# Patient Record
Sex: Male | Born: 1940
Health system: Southern US, Community
[De-identification: ages and names within clinical notes are randomized; demographics above are authoritative.]

## PROBLEM LIST (undated history)

## (undated) DIAGNOSIS — E119 Type 2 diabetes mellitus without complications: Secondary | ICD-10-CM

## (undated) DIAGNOSIS — I10 Essential (primary) hypertension: Secondary | ICD-10-CM

## (undated) DIAGNOSIS — E7800 Pure hypercholesterolemia, unspecified: Secondary | ICD-10-CM

## (undated) HISTORY — PX: TUMOR REMOVAL: SHX12

## (undated) HISTORY — PX: BACK SURGERY: SHX140

---

## 2006-08-20 ENCOUNTER — Observation Stay (HOSPITAL_COMMUNITY): Admission: AD | Admit: 2006-08-20 | Discharge: 2006-08-22 | Payer: Self-pay | Admitting: Cardiology

## 2006-08-20 ENCOUNTER — Ambulatory Visit: Payer: Self-pay | Admitting: Cardiology

## 2006-09-11 ENCOUNTER — Inpatient Hospital Stay (HOSPITAL_COMMUNITY): Admission: RE | Admit: 2006-09-11 | Discharge: 2006-09-15 | Payer: Self-pay | Admitting: Thoracic Surgery

## 2006-09-17 ENCOUNTER — Ambulatory Visit: Payer: Self-pay | Admitting: Cardiology

## 2006-09-26 ENCOUNTER — Encounter: Admission: RE | Admit: 2006-09-26 | Discharge: 2006-09-26 | Payer: Self-pay | Admitting: Thoracic Surgery

## 2006-10-31 ENCOUNTER — Encounter: Admission: RE | Admit: 2006-10-31 | Discharge: 2006-10-31 | Payer: Self-pay | Admitting: Thoracic Surgery

## 2006-12-26 ENCOUNTER — Ambulatory Visit: Payer: Self-pay | Admitting: Thoracic Surgery

## 2006-12-26 ENCOUNTER — Encounter: Admission: RE | Admit: 2006-12-26 | Discharge: 2006-12-26 | Payer: Self-pay | Admitting: Thoracic Surgery

## 2007-04-29 IMAGING — CR DG CHEST 2V
2 series · 2 of 2 positions shown · non-contrast
Comparison: 09/14/2006

CLINICAL DATA: Paraspinal mass

CHEST - 2 VIEW:

[w chest pa]
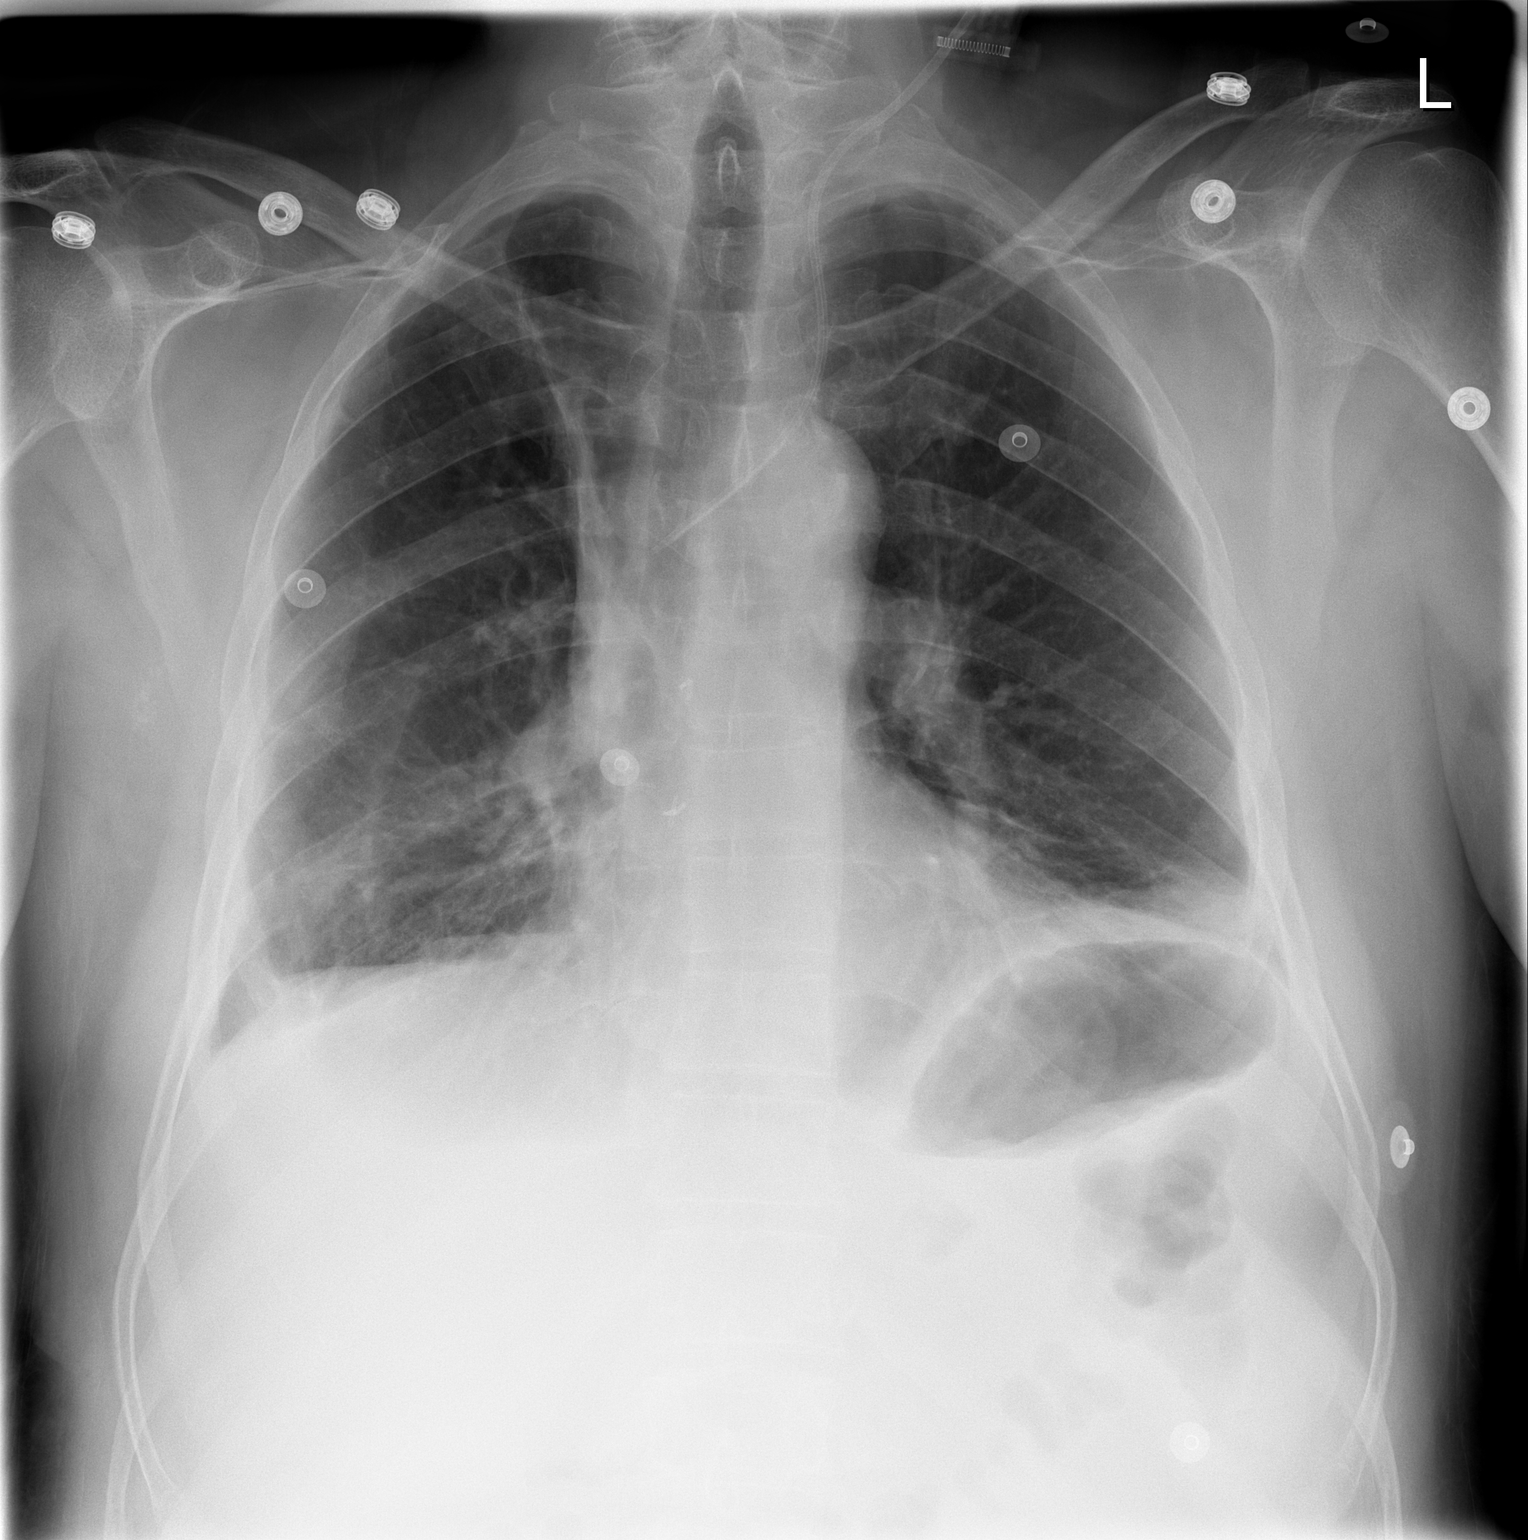

[w chest lat]
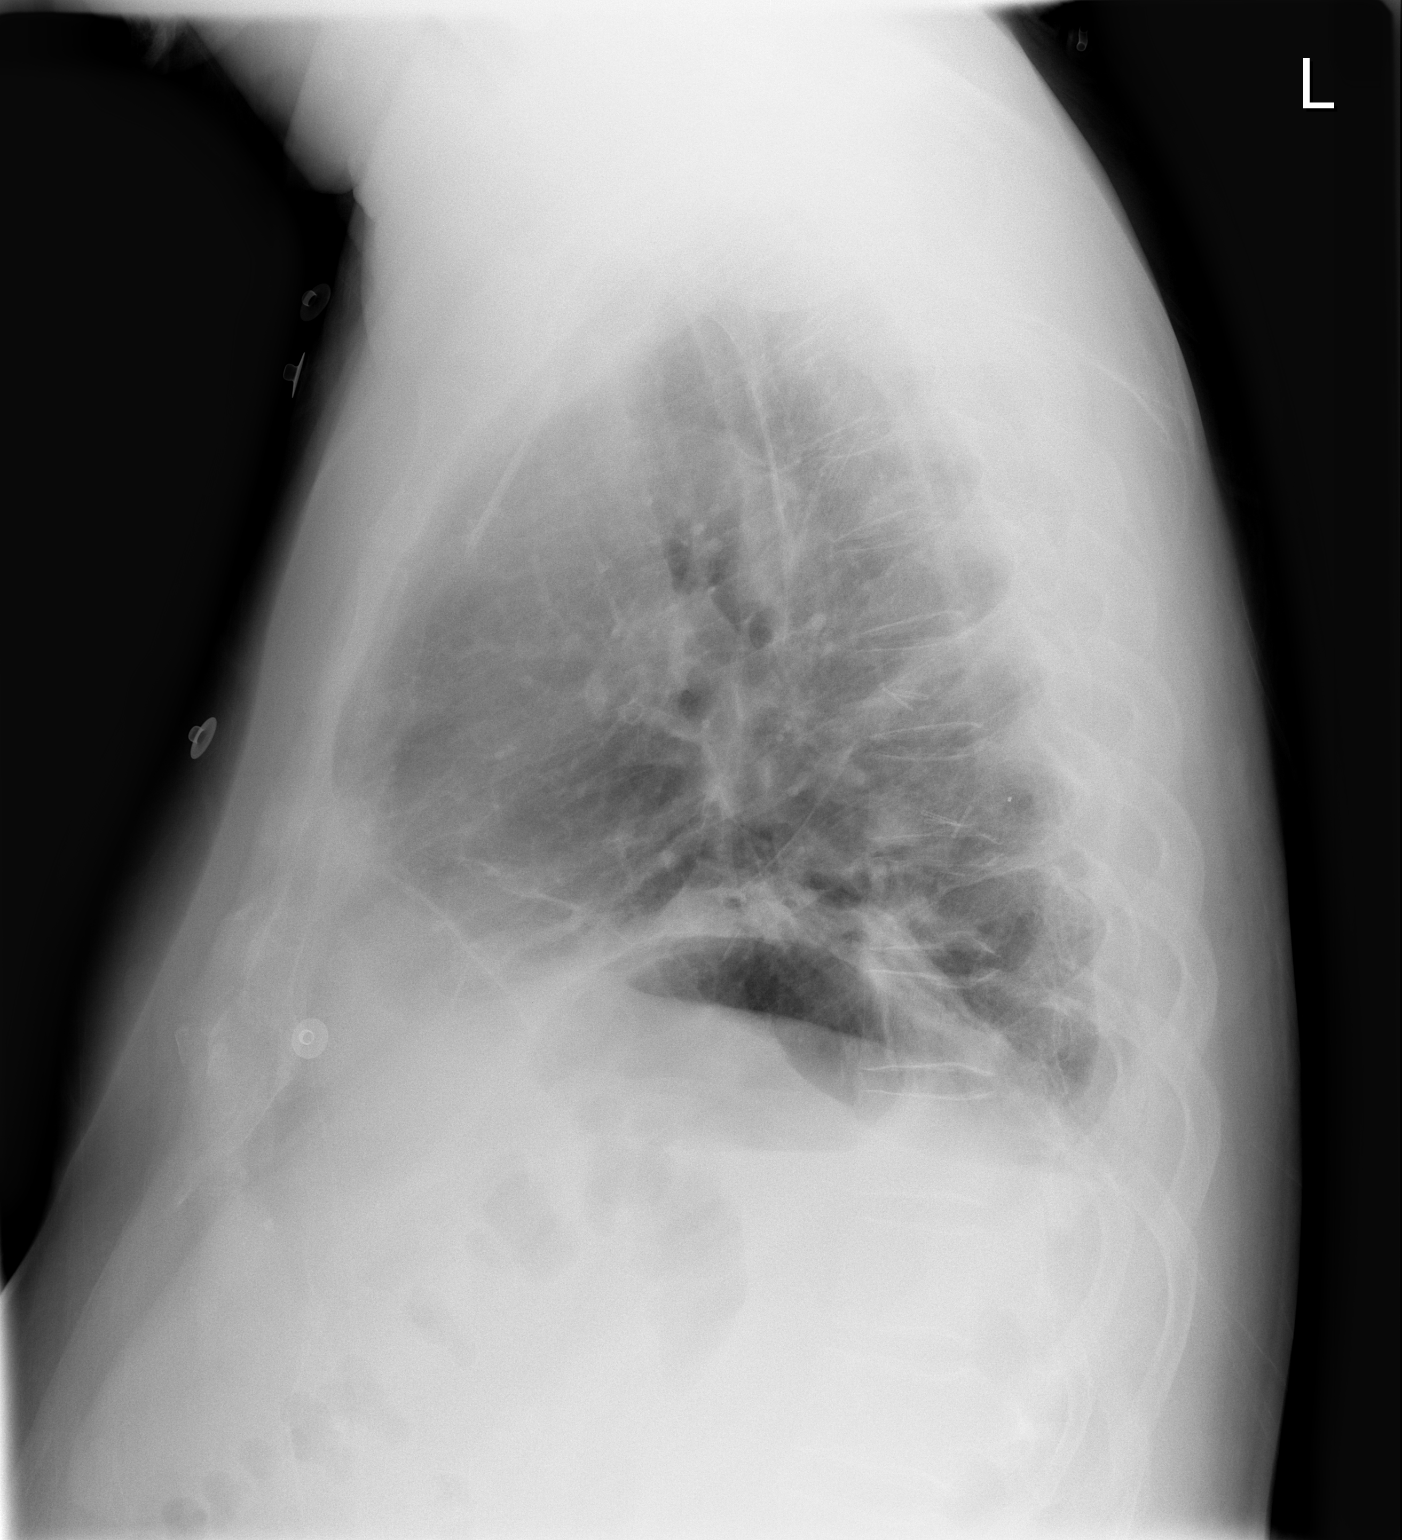

[2 of 2 positions shown; findings below may reference images not displayed]

FINDINGS: Left central line remains in place. There appear to be air-fluid
levels at the right lung base which could represent small loculated right
hydropneumothorax. This cannot be confirmed on the lateral view. Recommend
followup. There is bibasilar atelectasis, slightly decreased since prior study.
Slight increase in lung volumes. Heart is normal size.
IMPRESSION: Probable loculated right basilar hydropneumothorax. This cannot be confirmed on
the lateral. Recommend attention on followup studies.

Bibasilar atelectasis. Slight improvement in lung volumes.

## 2010-10-23 ENCOUNTER — Encounter: Payer: Self-pay | Admitting: Cardiovascular Disease

## 2010-10-23 ENCOUNTER — Encounter: Payer: Self-pay | Admitting: Thoracic Surgery

## 2013-10-26 ENCOUNTER — Emergency Department (HOSPITAL_COMMUNITY): Payer: Medicare PPO

## 2013-10-26 ENCOUNTER — Encounter (HOSPITAL_COMMUNITY): Payer: Self-pay | Admitting: Emergency Medicine

## 2013-10-26 ENCOUNTER — Emergency Department (HOSPITAL_COMMUNITY)
Admission: EM | Admit: 2013-10-26 | Discharge: 2013-10-27 | Disposition: A | Discharge status: Home / Self Care | Payer: Medicare PPO | Attending: Emergency Medicine | Admitting: Emergency Medicine

## 2013-10-26 DIAGNOSIS — E7800 Pure hypercholesterolemia, unspecified: Secondary | ICD-10-CM | POA: Insufficient documentation

## 2013-10-26 DIAGNOSIS — K5289 Other specified noninfective gastroenteritis and colitis: Secondary | ICD-10-CM | POA: Insufficient documentation

## 2013-10-26 DIAGNOSIS — Z880 Allergy status to penicillin: Secondary | ICD-10-CM | POA: Insufficient documentation

## 2013-10-26 DIAGNOSIS — Z79899 Other long term (current) drug therapy: Secondary | ICD-10-CM | POA: Insufficient documentation

## 2013-10-26 DIAGNOSIS — K529 Noninfective gastroenteritis and colitis, unspecified: Secondary | ICD-10-CM

## 2013-10-26 HISTORY — DX: Pure hypercholesterolemia, unspecified: E78.00

## 2013-10-26 LAB — URINALYSIS, ROUTINE W REFLEX MICROSCOPIC
BILIRUBIN URINE: NEGATIVE
Glucose, UA: NEGATIVE mg/dL
Ketones, ur: NEGATIVE mg/dL
LEUKOCYTES UA: NEGATIVE
NITRITE: NEGATIVE
PROTEIN: NEGATIVE mg/dL
Specific Gravity, Urine: >1.03 — ABNORMAL HIGH (ref 1.005–1.030)
UROBILINOGEN UA: 0.2 mg/dL (ref 0.0–1.0)
pH: 6 (ref 5.0–8.0)

## 2013-10-26 LAB — CBC WITH DIFFERENTIAL/PLATELET
BASOS PCT: 0 % (ref 0–1)
Basophils Absolute: 0 10*3/uL (ref 0.0–0.1)
Eosinophils Absolute: 0.1 10*3/uL (ref 0.0–0.7)
Eosinophils Relative: 1 % (ref 0–5)
HCT: 45.7 % (ref 39.0–52.0)
Hemoglobin: 15.7 g/dL (ref 13.0–17.0)
LYMPHS ABS: 2.8 10*3/uL (ref 0.7–4.0)
LYMPHS PCT: 22 % (ref 12–46)
MCH: 28.6 pg (ref 26.0–34.0)
MCHC: 34.4 g/dL (ref 30.0–36.0)
MCV: 83.4 fL (ref 78.0–100.0)
MONOS PCT: 11 % (ref 3–12)
Monocytes Absolute: 1.5 10*3/uL — ABNORMAL HIGH (ref 0.1–1.0)
NEUTROS ABS: 8.5 10*3/uL — AB (ref 1.7–7.7)
NEUTROS PCT: 66 % (ref 43–77)
PLATELETS: 249 10*3/uL (ref 150–400)
RBC: 5.48 MIL/uL (ref 4.22–5.81)
RDW: 12 % (ref 11.5–15.5)
WBC: 12.9 10*3/uL — ABNORMAL HIGH (ref 4.0–10.5)

## 2013-10-26 LAB — URINE MICROSCOPIC-ADD ON

## 2013-10-26 LAB — COMPREHENSIVE METABOLIC PANEL
ALBUMIN: 4 g/dL (ref 3.5–5.2)
ALT: 19 U/L (ref 0–53)
AST: 14 U/L (ref 0–37)
Alkaline Phosphatase: 78 U/L (ref 39–117)
BUN: 15 mg/dL (ref 6–23)
CALCIUM: 9.4 mg/dL (ref 8.4–10.5)
CHLORIDE: 94 meq/L — AB (ref 96–112)
CO2: 31 meq/L (ref 19–32)
Creatinine, Ser: 1.06 mg/dL (ref 0.50–1.35)
GFR calc non Af Amer: 69 mL/min — ABNORMAL LOW (ref >90)
GFR, EST AFRICAN AMERICAN: 80 mL/min — AB (ref >90)
GLUCOSE: 108 mg/dL — AB (ref 70–99)
Potassium: 3.5 mEq/L — ABNORMAL LOW (ref 3.7–5.3)
Sodium: 137 mEq/L (ref 137–147)
TOTAL PROTEIN: 8.1 g/dL (ref 6.0–8.3)
Total Bilirubin: 0.7 mg/dL (ref 0.3–1.2)

## 2013-10-26 LAB — LIPASE, BLOOD: Lipase: 20 U/L (ref 11–59)

## 2013-10-26 MED ORDER — IOHEXOL 300 MG/ML  SOLN
50.0000 mL | Freq: Once | INTRAMUSCULAR | Status: AC | PRN
  Administered 2013-10-26: 50 mL via ORAL

## 2013-10-26 MED ORDER — IOHEXOL 300 MG/ML  SOLN
100.0000 mL | Freq: Once | INTRAMUSCULAR | Status: AC | PRN
  Administered 2013-10-26: 100 mL via INTRAVENOUS

## 2013-10-26 MED ORDER — ONDANSETRON HCL 4 MG/2ML IJ SOLN
4.0000 mg | Freq: Once | INTRAMUSCULAR | Status: AC
  Administered 2013-10-26: 4 mg via INTRAVENOUS
  Filled 2013-10-26: qty 2

## 2013-10-26 MED ORDER — HYDROMORPHONE HCL PF 1 MG/ML IJ SOLN
0.5000 mg | Freq: Once | INTRAMUSCULAR | Status: AC
  Administered 2013-10-26: 0.5 mg via INTRAVENOUS
  Filled 2013-10-26: qty 1

## 2013-10-26 MED ORDER — ONDANSETRON 4 MG PO TBDP: ORAL_TABLET | ORAL | Status: DC

## 2013-10-26 MED ORDER — PANTOPRAZOLE SODIUM 40 MG IV SOLR
40.0000 mg | Freq: Once | INTRAVENOUS | Status: AC
  Administered 2013-10-26: 40 mg via INTRAVENOUS
  Filled 2013-10-26: qty 40

## 2013-10-26 MED ORDER — DICYCLOMINE HCL 20 MG PO TABS: ORAL_TABLET | ORAL | Status: DC

## 2013-10-26 NOTE — Discharge Instructions (Signed)
Follow up with your md in 2 days for recheck 

## 2013-10-26 NOTE — ED Notes (Addendum)
Patient with no complaints at this time. Respirations even and unlabored. Skin warm/dry. Discharge instructions reviewed with patient at this time. IV removed and bandaid applied. Patient given opportunity to voice concerns/ask questions.. Patient discharged at this time and left Emergency Department with steady gait.

## 2013-10-26 NOTE — ED Provider Notes (Signed)
CSN: [id number]     Arrival date & time 10/26/13  1656 History   First MD Initiated Contact with Patient 10/26/13 2002     Chief Complaint  Patient presents with  . Abdominal Pain   (Consider location/radiation/quality/duration/timing/severity/associated sxs/prior Treatment) Patient is a 73 y.o. male presenting with abdominal pain. The history is provided by the patient (pt comlains of vomiting and abd pain).  Abdominal Pain Pain location:  Generalized Pain quality: aching   Pain radiates to:  Does not radiate Pain severity:  Mild Onset quality:  Gradual Timing:  Intermittent Associated symptoms: vomiting   Associated symptoms: no chest pain, no cough, no diarrhea, no fatigue and no hematuria     Past Medical History  Diagnosis Date  . Elevated cholesterol    Past Surgical History  Procedure Laterality Date  . Tumor removal     No family history on file. History  Substance Use Topics  . Smoking status: Never Smoker   . Smokeless tobacco: Not on file  . Alcohol Use: No    Review of Systems  Constitutional: Negative for appetite change and fatigue.  HENT: Negative for congestion, ear discharge and sinus pressure.   Eyes: Negative for discharge.  Respiratory: Negative for cough.   Cardiovascular: Negative for chest pain.  Gastrointestinal: Positive for vomiting and abdominal pain. Negative for diarrhea.  Genitourinary: Negative for frequency and hematuria.  Musculoskeletal: Negative for back pain.  Skin: Negative for rash.  Neurological: Negative for seizures and headaches.  Psychiatric/Behavioral: Negative for hallucinations.    Allergies  Penicillins  Home Medications   Current Outpatient Rx  Name  Route  Sig  Dispense  Refill  . atorvastatin (LIPITOR) 20 MG tablet   Oral   Take 20 mg by mouth daily.         Marland Kitchen ibuprofen (ADVIL,MOTRIN) 200 MG tablet   Oral   Take 600 mg by mouth every 6 (six) hours as needed.         . metoprolol (LOPRESSOR) 50 MG  tablet   Oral   Take 50 mg by mouth 2 (two) times daily.         Marland Kitchen omeprazole (PRILOSEC) 40 MG capsule   Oral   Take 40 mg by mouth daily.          BP 128/68  Pulse 85  Temp(Src) 98.6 F (37 C) (Oral)  Resp 18  Ht 6' (1.829 m)  Wt 232 lb (105.235 kg)  BMI 31.46 kg/m2  SpO2 91% Physical Exam  Constitutional: He is oriented to person, place, and time. He appears well-developed.  HENT:  Head: Normocephalic.  Eyes: Conjunctivae and EOM are normal. No scleral icterus.  Neck: Neck supple. No thyromegaly present.  Cardiovascular: Normal rate and regular rhythm.  Exam reveals no gallop and no friction rub.   No murmur heard. Pulmonary/Chest: No stridor. He has no wheezes. He has no rales. He exhibits no tenderness.  Abdominal: He exhibits no distension. There is tenderness. There is no rebound.  Mild tenderness throughout  Musculoskeletal: Normal range of motion. He exhibits no edema.  Lymphadenopathy:    He has no cervical adenopathy.  Neurological: He is oriented to person, place, and time. He exhibits normal muscle tone. Coordination normal.  Skin: No rash noted. No erythema.  Psychiatric: He has a normal mood and affect. His behavior is normal.    ED Course  Procedures (including critical care time) Labs Review Labs Reviewed  CBC WITH DIFFERENTIAL - Abnormal; Notable for the  following:    WBC 12.9 (*)    Neutro Abs 8.5 (*)    Monocytes Absolute 1.5 (*)    All other components within normal limits  COMPREHENSIVE METABOLIC PANEL - Abnormal; Notable for the following:    Potassium 3.5 (*)    Chloride 94 (*)    Glucose, Bld 108 (*)    GFR calc non Af Amer 69 (*)    GFR calc Af Amer 80 (*)    All other components within normal limits  URINALYSIS, ROUTINE W REFLEX MICROSCOPIC - Abnormal; Notable for the following:    Specific Gravity, Urine >1.030 (*)    Hgb urine dipstick MODERATE (*)    All other components within normal limits  LIPASE, BLOOD  URINE  MICROSCOPIC-ADD ON   Imaging Review Ct Abdomen Pelvis W Contrast  10/26/2013   CLINICAL DATA:  Abdominal pain  EXAM: CT ABDOMEN AND PELVIS WITH CONTRAST  TECHNIQUE: Multidetector CT imaging of the abdomen and pelvis was performed using the standard protocol following bolus administration of intravenous contrast.  CONTRAST:  50mL OMNIPAQUE IOHEXOL 300 MG/ML SOLN, 100mL OMNIPAQUE IOHEXOL 300 MG/ML SOLN  COMPARISON:  06/22/2009  FINDINGS: Granuloma right lower lobe. Coronary artery and/or aortic valvular calcification. Normal heart size. Mild patchy/linear left lung base opacity, favor atelectasis and/or scarring. Left hemidiaphragm elevation.  Sub cm hypodensity within the left hepatic lobe is unchanged. There are gallstones suspected. No biliary ductal dilatation. No pericholecystic fat stranding or fluid. No appreciable abnormality of the spleen, pancreas, adrenal glands, kidneys. No hydroureteronephrosis.  Mild colonic diverticulosis. No CT evidence for colitis or diverticulitis. Appendix within normal limits. Mucosal hyper enhancement and mesenteric vascular engorgement involving segments of small bowel within the pelvis (image 92/115 as index). Associated fluid collects along the left pelvis/lower paracolic gutter. No bowel wall pneumatosis. No free intraperitoneal air. No lymphadenopathy.  Scattered atherosclerosis of the aorta and branch vessels. No aneurysmal dilatation or central vascular occlusion.  Partially decompressed bladder. Prostatomegaly. Small fat containing right inguinal hernia also contains a portion of the anterior right bladder wall. Small fat containing left inguinal hernia and/or spermatic cord lipoma.  Osteopenia.  No acute osseous finding.  IMPRESSION: Thickened small bowel loops within the pelvis with adjacent fluid and mesenteric vessel engorgement. Favored to reflect a nonspecific enteritis; with ischemic, infectious, and inflammatory considerations.  Gallstones are suspected. No  definitive evidence for active inflammation. Correlate with ultrasound if there is concern for acute biliary pathology.   Electronically Signed   By: Jearld LeschAndrew  DelGaizo M.D.   On: 10/26/2013 23:17    EKG Interpretation   None       MDM  gastroenteritis    Benny LennertJoseph L Yanin Muhlestein, MD 10/26/13 2328

## 2013-10-26 NOTE — ED Notes (Signed)
Pt was seen by pcp on Tuesday given steroid and pneumonia shot, started having n/v/d, abd pain Tuesday evening, last bowel movement was this am, eating and bending over makes the pain worse.

## 2014-06-09 IMAGING — CT CT ABD-PELV W/ CM
2 of 4 series · 15 of 46 positions shown, 17 images · IV contrast (Omnipaque 300)
Comparison: 06/22/2009

CLINICAL DATA: Abdominal pain

EXAM:
CT ABDOMEN AND PELVIS WITH CONTRAST
TECHNIQUE: Multidetector CT imaging of the abdomen and pelvis was performed
using the standard protocol following bolus administration of
intravenous contrast.
CONTRAST:  50mL OMNIPAQUE IOHEXOL 300 MG/ML SOLN, 100mL OMNIPAQUE
IOHEXOL 300 MG/ML SOLN

[Series 2: abd_pel_with 5.0 b40f · axial · 0.78mm/px · z∈[+494,+1009]mm · 12 of 115 slices shown, 14 images]
[im 6/115  soft-tissue]
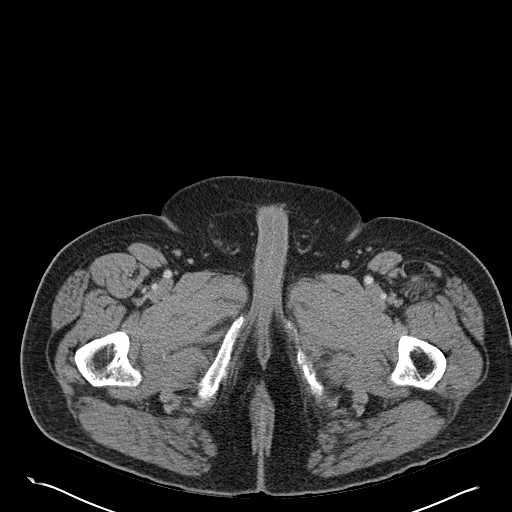
[im 6/115  bone]
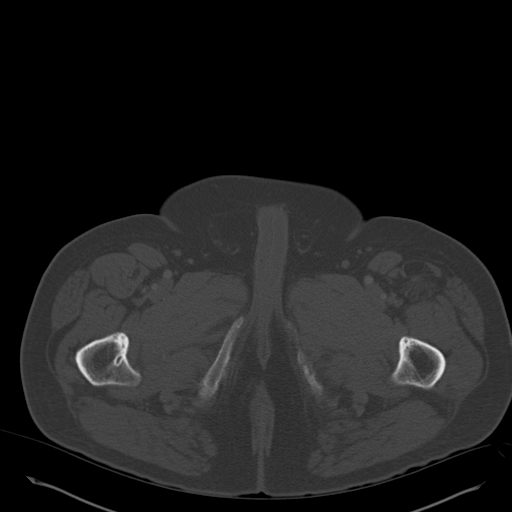
[im 17/115  soft-tissue]
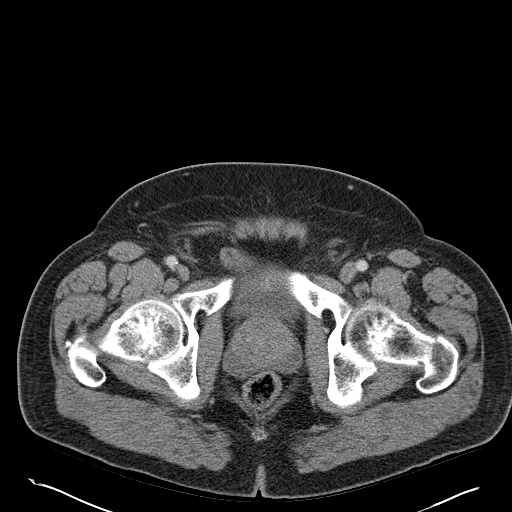
[im 28/115  soft-tissue]
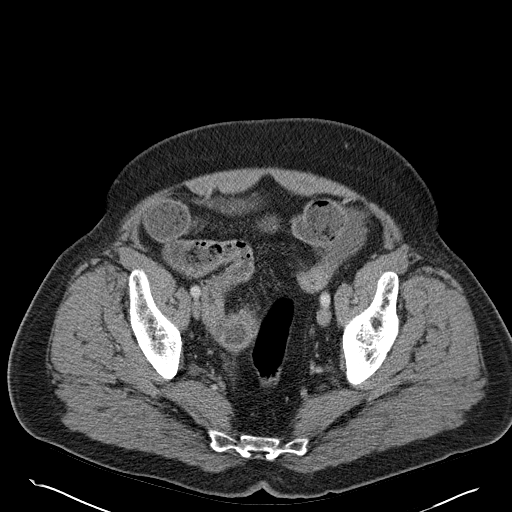
[im 33/115  soft-tissue]
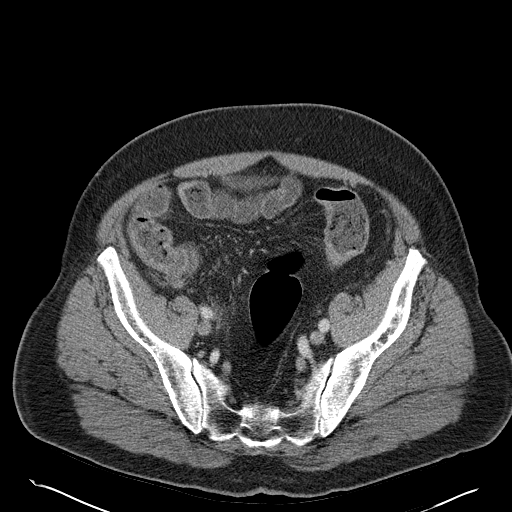
[im 44/115  soft-tissue]
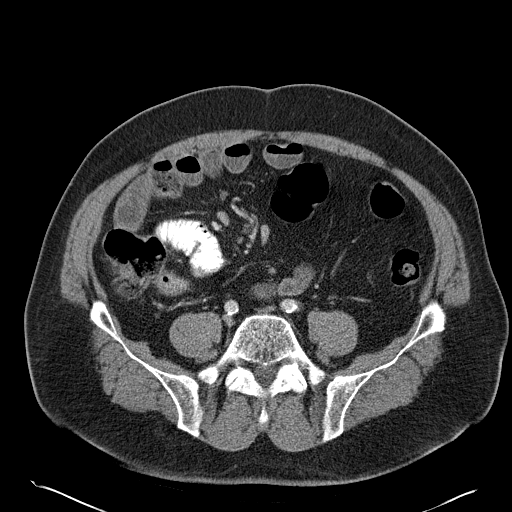
[im 55/115  soft-tissue]
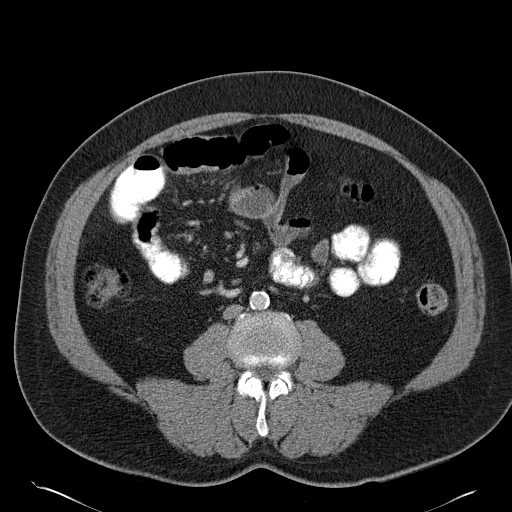
[im 60/115  soft-tissue]
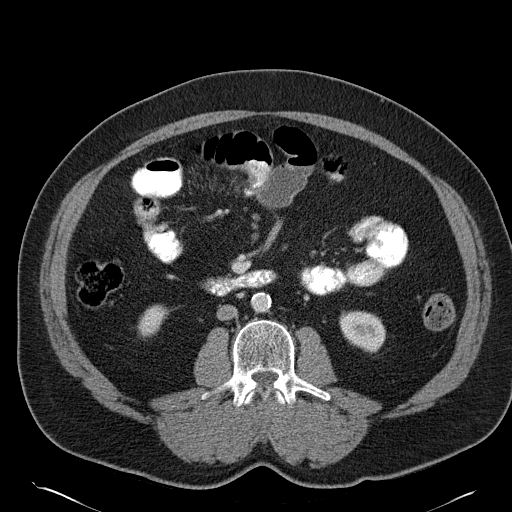
[im 71/115  soft-tissue]
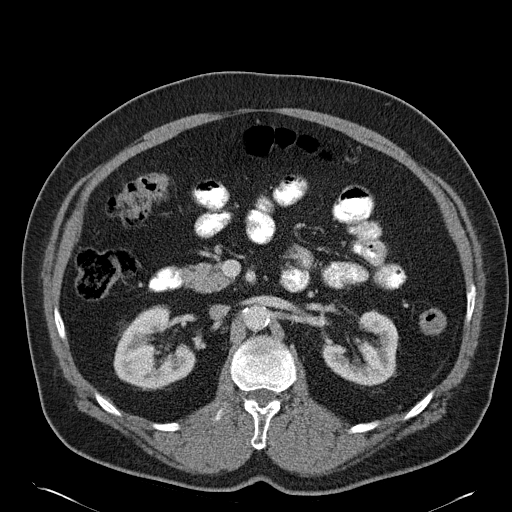
[im 82/115  soft-tissue]
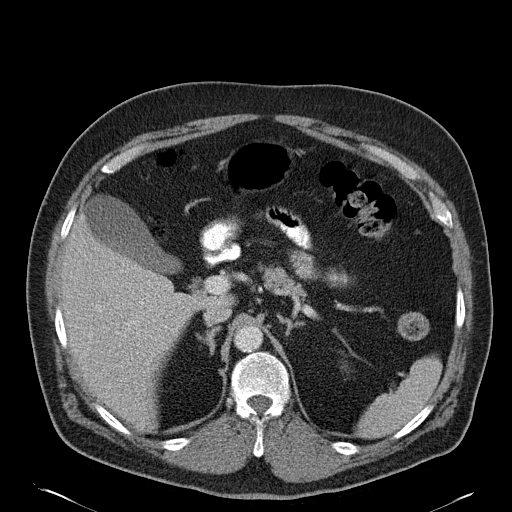
[im 82/115  bone]
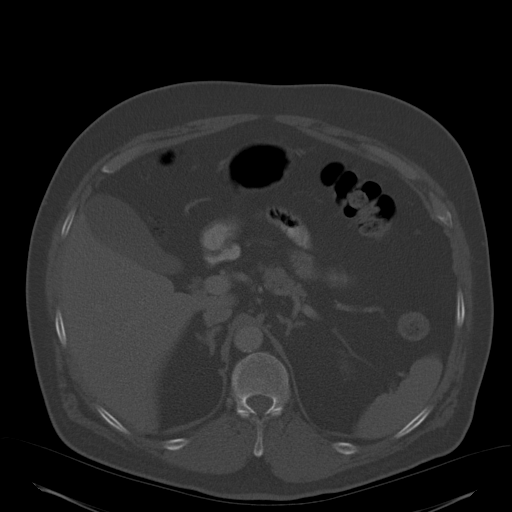
[im 87/115  soft-tissue]
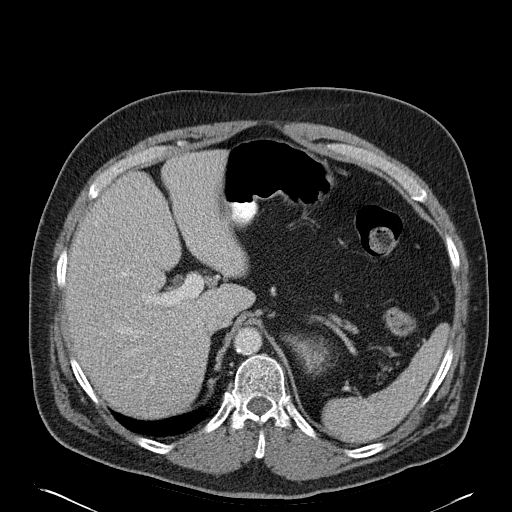
[im 98/115  soft-tissue]
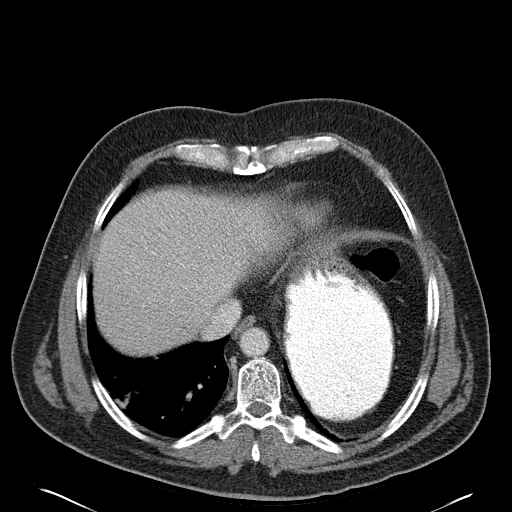
[im 109/115  soft-tissue]
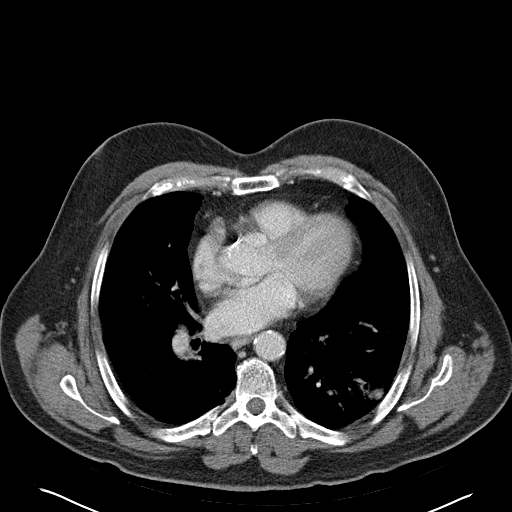

[Series 4: abd_pel_with 3.0 spo cor · coronal · 0.75mm/px · 3 of 101 slices shown]
[im 34/101  soft-tissue]
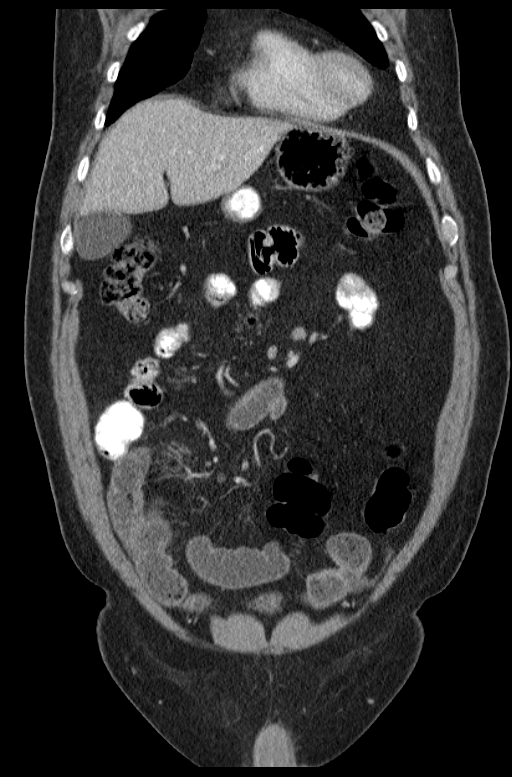
[im 45/101  soft-tissue]
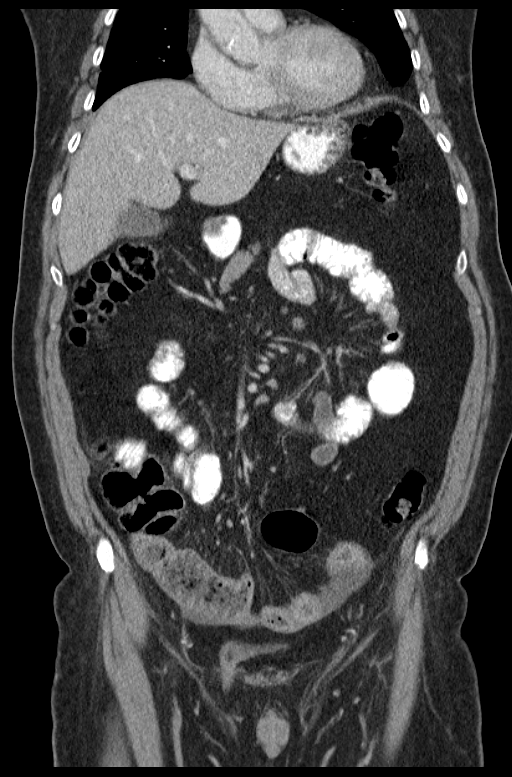
[im 56/101  soft-tissue]
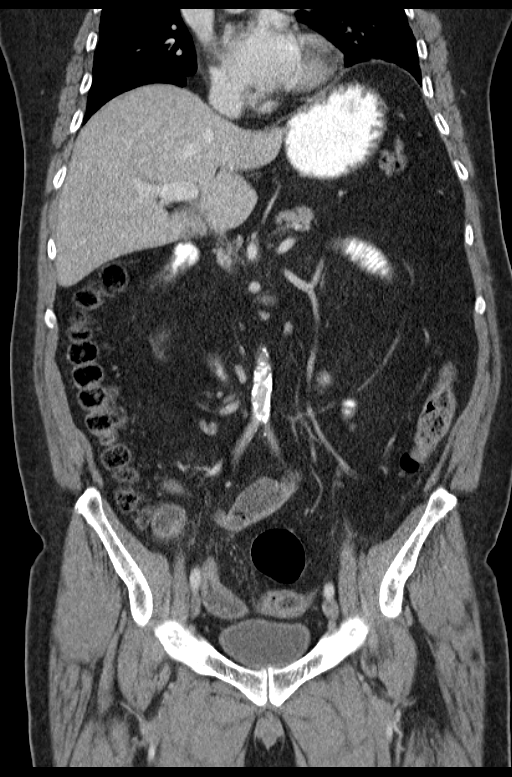

[15 of 46 positions shown; findings below may reference images not displayed]

FINDINGS: Granuloma right lower lobe. Coronary artery and/or aortic valvular
calcification. Normal heart size. Mild patchy/linear left lung base
opacity, favor atelectasis and/or scarring. Left hemidiaphragm
elevation.

Sub cm hypodensity within the left hepatic lobe is unchanged. There
are gallstones suspected. No biliary ductal dilatation. No
pericholecystic fat stranding or fluid. No appreciable abnormality
of the spleen, pancreas, adrenal glands, kidneys. No
hydroureteronephrosis.

Mild colonic diverticulosis. No CT evidence for colitis or
diverticulitis. Appendix within normal limits. Mucosal hyper
enhancement and mesenteric vascular engorgement involving segments
of small bowel within the pelvis (image 92/115 as index). Associated
fluid collects along the left pelvis/lower paracolic gutter. No
bowel wall pneumatosis. No free intraperitoneal air. No
lymphadenopathy.

Scattered atherosclerosis of the aorta and branch vessels. No
aneurysmal dilatation or central vascular occlusion.

Partially decompressed bladder. Prostatomegaly. Small fat containing
right inguinal hernia also contains a portion of the anterior right
bladder wall. Small fat containing left inguinal hernia and/or
spermatic cord lipoma.

Osteopenia.  No acute osseous finding.
IMPRESSION: Thickened small bowel loops within the pelvis with adjacent fluid
and mesenteric vessel engorgement. Favored to reflect a nonspecific
enteritis; with ischemic, infectious, and inflammatory
considerations.

Gallstones are suspected. No definitive evidence for active
inflammation. Correlate with ultrasound if there is concern for
acute biliary pathology.

## 2015-11-09 DIAGNOSIS — I10 Essential (primary) hypertension: Secondary | ICD-10-CM | POA: Diagnosis not present

## 2015-11-09 DIAGNOSIS — M545 Low back pain: Secondary | ICD-10-CM | POA: Diagnosis not present

## 2015-11-09 DIAGNOSIS — R7301 Impaired fasting glucose: Secondary | ICD-10-CM | POA: Diagnosis not present

## 2015-11-09 DIAGNOSIS — K219 Gastro-esophageal reflux disease without esophagitis: Secondary | ICD-10-CM | POA: Diagnosis not present

## 2015-11-09 DIAGNOSIS — E782 Mixed hyperlipidemia: Secondary | ICD-10-CM | POA: Diagnosis not present

## 2015-11-16 DIAGNOSIS — M1711 Unilateral primary osteoarthritis, right knee: Secondary | ICD-10-CM | POA: Diagnosis not present

## 2015-11-16 DIAGNOSIS — K219 Gastro-esophageal reflux disease without esophagitis: Secondary | ICD-10-CM | POA: Diagnosis not present

## 2015-11-16 DIAGNOSIS — I10 Essential (primary) hypertension: Secondary | ICD-10-CM | POA: Diagnosis not present

## 2015-11-16 DIAGNOSIS — R7301 Impaired fasting glucose: Secondary | ICD-10-CM | POA: Diagnosis not present

## 2015-11-16 DIAGNOSIS — Z0001 Encounter for general adult medical examination with abnormal findings: Secondary | ICD-10-CM | POA: Diagnosis not present

## 2015-11-16 DIAGNOSIS — E782 Mixed hyperlipidemia: Secondary | ICD-10-CM | POA: Diagnosis not present

## 2015-11-16 DIAGNOSIS — M1712 Unilateral primary osteoarthritis, left knee: Secondary | ICD-10-CM | POA: Diagnosis not present

## 2016-05-24 DIAGNOSIS — K219 Gastro-esophageal reflux disease without esophagitis: Secondary | ICD-10-CM | POA: Diagnosis not present

## 2016-05-24 DIAGNOSIS — I10 Essential (primary) hypertension: Secondary | ICD-10-CM | POA: Diagnosis not present

## 2016-05-24 DIAGNOSIS — R7301 Impaired fasting glucose: Secondary | ICD-10-CM | POA: Diagnosis not present

## 2016-05-24 DIAGNOSIS — E782 Mixed hyperlipidemia: Secondary | ICD-10-CM | POA: Diagnosis not present

## 2016-05-31 DIAGNOSIS — R7301 Impaired fasting glucose: Secondary | ICD-10-CM | POA: Diagnosis not present

## 2016-05-31 DIAGNOSIS — E782 Mixed hyperlipidemia: Secondary | ICD-10-CM | POA: Diagnosis not present

## 2016-05-31 DIAGNOSIS — Z6830 Body mass index (BMI) 30.0-30.9, adult: Secondary | ICD-10-CM | POA: Diagnosis not present

## 2016-05-31 DIAGNOSIS — H6123 Impacted cerumen, bilateral: Secondary | ICD-10-CM | POA: Diagnosis not present

## 2016-05-31 DIAGNOSIS — K219 Gastro-esophageal reflux disease without esophagitis: Secondary | ICD-10-CM | POA: Diagnosis not present

## 2016-05-31 DIAGNOSIS — I10 Essential (primary) hypertension: Secondary | ICD-10-CM | POA: Diagnosis not present

## 2016-06-06 DIAGNOSIS — R1013 Epigastric pain: Secondary | ICD-10-CM | POA: Diagnosis not present

## 2016-06-06 DIAGNOSIS — B9681 Helicobacter pylori [H. pylori] as the cause of diseases classified elsewhere: Secondary | ICD-10-CM | POA: Diagnosis not present

## 2016-06-12 DIAGNOSIS — I701 Atherosclerosis of renal artery: Secondary | ICD-10-CM | POA: Diagnosis not present

## 2016-06-12 DIAGNOSIS — R1013 Epigastric pain: Secondary | ICD-10-CM | POA: Diagnosis not present

## 2016-06-12 DIAGNOSIS — N400 Enlarged prostate without lower urinary tract symptoms: Secondary | ICD-10-CM | POA: Diagnosis not present

## 2016-06-12 DIAGNOSIS — K219 Gastro-esophageal reflux disease without esophagitis: Secondary | ICD-10-CM | POA: Diagnosis not present

## 2016-07-04 DIAGNOSIS — Z23 Encounter for immunization: Secondary | ICD-10-CM | POA: Diagnosis not present

## 2016-09-08 DIAGNOSIS — K219 Gastro-esophageal reflux disease without esophagitis: Secondary | ICD-10-CM | POA: Diagnosis not present

## 2016-09-08 DIAGNOSIS — R1310 Dysphagia, unspecified: Secondary | ICD-10-CM | POA: Diagnosis not present

## 2016-09-20 DIAGNOSIS — Z7982 Long term (current) use of aspirin: Secondary | ICD-10-CM | POA: Diagnosis not present

## 2016-09-20 DIAGNOSIS — E785 Hyperlipidemia, unspecified: Secondary | ICD-10-CM | POA: Diagnosis not present

## 2016-09-20 DIAGNOSIS — K319 Disease of stomach and duodenum, unspecified: Secondary | ICD-10-CM | POA: Diagnosis not present

## 2016-09-20 DIAGNOSIS — N400 Enlarged prostate without lower urinary tract symptoms: Secondary | ICD-10-CM | POA: Diagnosis not present

## 2016-09-20 DIAGNOSIS — I10 Essential (primary) hypertension: Secondary | ICD-10-CM | POA: Diagnosis not present

## 2016-09-20 DIAGNOSIS — Z79899 Other long term (current) drug therapy: Secondary | ICD-10-CM | POA: Diagnosis not present

## 2016-09-20 DIAGNOSIS — K219 Gastro-esophageal reflux disease without esophagitis: Secondary | ICD-10-CM | POA: Diagnosis not present

## 2016-09-20 DIAGNOSIS — R1310 Dysphagia, unspecified: Secondary | ICD-10-CM | POA: Diagnosis not present

## 2016-09-20 DIAGNOSIS — K228 Other specified diseases of esophagus: Secondary | ICD-10-CM | POA: Diagnosis not present

## 2016-09-20 DIAGNOSIS — K222 Esophageal obstruction: Secondary | ICD-10-CM | POA: Diagnosis not present

## 2016-11-23 DIAGNOSIS — E782 Mixed hyperlipidemia: Secondary | ICD-10-CM | POA: Diagnosis not present

## 2016-11-23 DIAGNOSIS — R7301 Impaired fasting glucose: Secondary | ICD-10-CM | POA: Diagnosis not present

## 2016-11-23 DIAGNOSIS — K219 Gastro-esophageal reflux disease without esophagitis: Secondary | ICD-10-CM | POA: Diagnosis not present

## 2016-11-23 DIAGNOSIS — I10 Essential (primary) hypertension: Secondary | ICD-10-CM | POA: Diagnosis not present

## 2016-11-27 DIAGNOSIS — E782 Mixed hyperlipidemia: Secondary | ICD-10-CM | POA: Diagnosis not present

## 2016-11-27 DIAGNOSIS — Z0001 Encounter for general adult medical examination with abnormal findings: Secondary | ICD-10-CM | POA: Diagnosis not present

## 2016-11-27 DIAGNOSIS — R7301 Impaired fasting glucose: Secondary | ICD-10-CM | POA: Diagnosis not present

## 2016-11-27 DIAGNOSIS — N401 Enlarged prostate with lower urinary tract symptoms: Secondary | ICD-10-CM | POA: Diagnosis not present

## 2016-11-27 DIAGNOSIS — K219 Gastro-esophageal reflux disease without esophagitis: Secondary | ICD-10-CM | POA: Diagnosis not present

## 2016-11-27 DIAGNOSIS — R351 Nocturia: Secondary | ICD-10-CM | POA: Diagnosis not present

## 2016-11-27 DIAGNOSIS — I10 Essential (primary) hypertension: Secondary | ICD-10-CM | POA: Diagnosis not present

## 2016-11-27 DIAGNOSIS — D1803 Hemangioma of intra-abdominal structures: Secondary | ICD-10-CM | POA: Diagnosis not present

## 2017-05-10 DIAGNOSIS — D127 Benign neoplasm of rectosigmoid junction: Secondary | ICD-10-CM | POA: Diagnosis not present

## 2017-05-10 DIAGNOSIS — K635 Polyp of colon: Secondary | ICD-10-CM | POA: Diagnosis not present

## 2017-05-10 DIAGNOSIS — D126 Benign neoplasm of colon, unspecified: Secondary | ICD-10-CM | POA: Diagnosis not present

## 2017-05-10 DIAGNOSIS — Z7984 Long term (current) use of oral hypoglycemic drugs: Secondary | ICD-10-CM | POA: Diagnosis not present

## 2017-05-10 DIAGNOSIS — M1990 Unspecified osteoarthritis, unspecified site: Secondary | ICD-10-CM | POA: Diagnosis not present

## 2017-05-10 DIAGNOSIS — E119 Type 2 diabetes mellitus without complications: Secondary | ICD-10-CM | POA: Diagnosis not present

## 2017-05-10 DIAGNOSIS — Z1211 Encounter for screening for malignant neoplasm of colon: Secondary | ICD-10-CM | POA: Diagnosis not present

## 2017-05-10 DIAGNOSIS — Z79899 Other long term (current) drug therapy: Secondary | ICD-10-CM | POA: Diagnosis not present

## 2017-05-10 DIAGNOSIS — I10 Essential (primary) hypertension: Secondary | ICD-10-CM | POA: Diagnosis not present

## 2017-05-10 DIAGNOSIS — K219 Gastro-esophageal reflux disease without esophagitis: Secondary | ICD-10-CM | POA: Diagnosis not present

## 2017-05-16 DIAGNOSIS — I10 Essential (primary) hypertension: Secondary | ICD-10-CM | POA: Diagnosis not present

## 2017-05-16 DIAGNOSIS — R7301 Impaired fasting glucose: Secondary | ICD-10-CM | POA: Diagnosis not present

## 2017-05-16 DIAGNOSIS — K219 Gastro-esophageal reflux disease without esophagitis: Secondary | ICD-10-CM | POA: Diagnosis not present

## 2017-05-16 DIAGNOSIS — K5730 Diverticulosis of large intestine without perforation or abscess without bleeding: Secondary | ICD-10-CM | POA: Diagnosis not present

## 2017-05-16 DIAGNOSIS — M1711 Unilateral primary osteoarthritis, right knee: Secondary | ICD-10-CM | POA: Diagnosis not present

## 2017-05-16 DIAGNOSIS — E782 Mixed hyperlipidemia: Secondary | ICD-10-CM | POA: Diagnosis not present

## 2017-05-16 DIAGNOSIS — M1712 Unilateral primary osteoarthritis, left knee: Secondary | ICD-10-CM | POA: Diagnosis not present

## 2017-05-18 DIAGNOSIS — K5730 Diverticulosis of large intestine without perforation or abscess without bleeding: Secondary | ICD-10-CM | POA: Diagnosis not present

## 2017-05-18 DIAGNOSIS — R7301 Impaired fasting glucose: Secondary | ICD-10-CM | POA: Diagnosis not present

## 2017-05-18 DIAGNOSIS — Z6829 Body mass index (BMI) 29.0-29.9, adult: Secondary | ICD-10-CM | POA: Diagnosis not present

## 2017-05-18 DIAGNOSIS — N401 Enlarged prostate with lower urinary tract symptoms: Secondary | ICD-10-CM | POA: Diagnosis not present

## 2017-05-18 DIAGNOSIS — R351 Nocturia: Secondary | ICD-10-CM | POA: Diagnosis not present

## 2017-05-18 DIAGNOSIS — I10 Essential (primary) hypertension: Secondary | ICD-10-CM | POA: Diagnosis not present

## 2017-05-18 DIAGNOSIS — I700 Atherosclerosis of aorta: Secondary | ICD-10-CM | POA: Diagnosis not present

## 2017-05-18 DIAGNOSIS — E782 Mixed hyperlipidemia: Secondary | ICD-10-CM | POA: Diagnosis not present

## 2017-06-18 DIAGNOSIS — D126 Benign neoplasm of colon, unspecified: Secondary | ICD-10-CM | POA: Insufficient documentation

## 2017-06-19 DIAGNOSIS — H6123 Impacted cerumen, bilateral: Secondary | ICD-10-CM | POA: Diagnosis not present

## 2017-06-19 DIAGNOSIS — Z23 Encounter for immunization: Secondary | ICD-10-CM | POA: Diagnosis not present

## 2017-07-07 DIAGNOSIS — Z6829 Body mass index (BMI) 29.0-29.9, adult: Secondary | ICD-10-CM | POA: Diagnosis not present

## 2017-07-07 DIAGNOSIS — R05 Cough: Secondary | ICD-10-CM | POA: Diagnosis not present

## 2017-07-07 DIAGNOSIS — J3089 Other allergic rhinitis: Secondary | ICD-10-CM | POA: Diagnosis not present

## 2017-11-19 DIAGNOSIS — Z6828 Body mass index (BMI) 28.0-28.9, adult: Secondary | ICD-10-CM | POA: Diagnosis not present

## 2017-11-19 DIAGNOSIS — J019 Acute sinusitis, unspecified: Secondary | ICD-10-CM | POA: Diagnosis not present

## 2017-11-19 DIAGNOSIS — R05 Cough: Secondary | ICD-10-CM | POA: Diagnosis not present

## 2017-11-27 DIAGNOSIS — R7301 Impaired fasting glucose: Secondary | ICD-10-CM | POA: Diagnosis not present

## 2017-11-27 DIAGNOSIS — E559 Vitamin D deficiency, unspecified: Secondary | ICD-10-CM | POA: Diagnosis not present

## 2017-11-27 DIAGNOSIS — I700 Atherosclerosis of aorta: Secondary | ICD-10-CM | POA: Diagnosis not present

## 2017-11-27 DIAGNOSIS — E782 Mixed hyperlipidemia: Secondary | ICD-10-CM | POA: Diagnosis not present

## 2017-11-27 DIAGNOSIS — K219 Gastro-esophageal reflux disease without esophagitis: Secondary | ICD-10-CM | POA: Diagnosis not present

## 2017-11-27 DIAGNOSIS — I10 Essential (primary) hypertension: Secondary | ICD-10-CM | POA: Diagnosis not present

## 2017-11-30 DIAGNOSIS — M1712 Unilateral primary osteoarthritis, left knee: Secondary | ICD-10-CM | POA: Diagnosis not present

## 2017-11-30 DIAGNOSIS — Z0001 Encounter for general adult medical examination with abnormal findings: Secondary | ICD-10-CM | POA: Diagnosis not present

## 2017-11-30 DIAGNOSIS — K5730 Diverticulosis of large intestine without perforation or abscess without bleeding: Secondary | ICD-10-CM | POA: Diagnosis not present

## 2017-11-30 DIAGNOSIS — E782 Mixed hyperlipidemia: Secondary | ICD-10-CM | POA: Diagnosis not present

## 2017-11-30 DIAGNOSIS — K219 Gastro-esophageal reflux disease without esophagitis: Secondary | ICD-10-CM | POA: Diagnosis not present

## 2017-11-30 DIAGNOSIS — N401 Enlarged prostate with lower urinary tract symptoms: Secondary | ICD-10-CM | POA: Diagnosis not present

## 2017-11-30 DIAGNOSIS — I10 Essential (primary) hypertension: Secondary | ICD-10-CM | POA: Diagnosis not present

## 2017-11-30 DIAGNOSIS — I700 Atherosclerosis of aorta: Secondary | ICD-10-CM | POA: Diagnosis not present

## 2017-11-30 DIAGNOSIS — M1711 Unilateral primary osteoarthritis, right knee: Secondary | ICD-10-CM | POA: Diagnosis not present

## 2018-05-20 DIAGNOSIS — E782 Mixed hyperlipidemia: Secondary | ICD-10-CM | POA: Diagnosis not present

## 2018-05-20 DIAGNOSIS — R7301 Impaired fasting glucose: Secondary | ICD-10-CM | POA: Diagnosis not present

## 2018-05-20 DIAGNOSIS — E559 Vitamin D deficiency, unspecified: Secondary | ICD-10-CM | POA: Diagnosis not present

## 2018-05-20 DIAGNOSIS — K219 Gastro-esophageal reflux disease without esophagitis: Secondary | ICD-10-CM | POA: Diagnosis not present

## 2018-05-20 DIAGNOSIS — I10 Essential (primary) hypertension: Secondary | ICD-10-CM | POA: Diagnosis not present

## 2018-05-21 DIAGNOSIS — E119 Type 2 diabetes mellitus without complications: Secondary | ICD-10-CM | POA: Diagnosis not present

## 2018-05-31 DIAGNOSIS — I700 Atherosclerosis of aorta: Secondary | ICD-10-CM | POA: Diagnosis not present

## 2018-05-31 DIAGNOSIS — E782 Mixed hyperlipidemia: Secondary | ICD-10-CM | POA: Diagnosis not present

## 2018-05-31 DIAGNOSIS — Z1331 Encounter for screening for depression: Secondary | ICD-10-CM | POA: Diagnosis not present

## 2018-05-31 DIAGNOSIS — I10 Essential (primary) hypertension: Secondary | ICD-10-CM | POA: Diagnosis not present

## 2018-05-31 DIAGNOSIS — K219 Gastro-esophageal reflux disease without esophagitis: Secondary | ICD-10-CM | POA: Diagnosis not present

## 2018-05-31 DIAGNOSIS — N401 Enlarged prostate with lower urinary tract symptoms: Secondary | ICD-10-CM | POA: Diagnosis not present

## 2018-05-31 DIAGNOSIS — Z1389 Encounter for screening for other disorder: Secondary | ICD-10-CM | POA: Diagnosis not present

## 2018-05-31 DIAGNOSIS — K5730 Diverticulosis of large intestine without perforation or abscess without bleeding: Secondary | ICD-10-CM | POA: Diagnosis not present

## 2018-06-19 DIAGNOSIS — I10 Essential (primary) hypertension: Secondary | ICD-10-CM | POA: Diagnosis not present

## 2018-06-19 DIAGNOSIS — I700 Atherosclerosis of aorta: Secondary | ICD-10-CM | POA: Diagnosis not present

## 2018-06-19 DIAGNOSIS — Z23 Encounter for immunization: Secondary | ICD-10-CM | POA: Diagnosis not present

## 2018-06-19 DIAGNOSIS — Z6828 Body mass index (BMI) 28.0-28.9, adult: Secondary | ICD-10-CM | POA: Diagnosis not present

## 2018-06-19 DIAGNOSIS — R42 Dizziness and giddiness: Secondary | ICD-10-CM | POA: Diagnosis not present

## 2018-06-24 DIAGNOSIS — I6523 Occlusion and stenosis of bilateral carotid arteries: Secondary | ICD-10-CM | POA: Diagnosis not present

## 2018-06-24 DIAGNOSIS — I119 Hypertensive heart disease without heart failure: Secondary | ICD-10-CM | POA: Diagnosis not present

## 2018-07-11 DIAGNOSIS — R51 Headache: Secondary | ICD-10-CM | POA: Diagnosis not present

## 2018-07-11 DIAGNOSIS — R42 Dizziness and giddiness: Secondary | ICD-10-CM | POA: Diagnosis not present

## 2018-07-11 DIAGNOSIS — I10 Essential (primary) hypertension: Secondary | ICD-10-CM | POA: Diagnosis not present

## 2018-09-30 DIAGNOSIS — Z6828 Body mass index (BMI) 28.0-28.9, adult: Secondary | ICD-10-CM | POA: Diagnosis not present

## 2018-09-30 DIAGNOSIS — H6123 Impacted cerumen, bilateral: Secondary | ICD-10-CM | POA: Diagnosis not present

## 2018-09-30 DIAGNOSIS — H9193 Unspecified hearing loss, bilateral: Secondary | ICD-10-CM | POA: Diagnosis not present

## 2018-11-04 DIAGNOSIS — J019 Acute sinusitis, unspecified: Secondary | ICD-10-CM | POA: Diagnosis not present

## 2018-11-04 DIAGNOSIS — R509 Fever, unspecified: Secondary | ICD-10-CM | POA: Diagnosis not present

## 2018-11-04 DIAGNOSIS — Z6828 Body mass index (BMI) 28.0-28.9, adult: Secondary | ICD-10-CM | POA: Diagnosis not present

## 2018-11-04 DIAGNOSIS — R5383 Other fatigue: Secondary | ICD-10-CM | POA: Diagnosis not present

## 2018-11-22 DIAGNOSIS — I10 Essential (primary) hypertension: Secondary | ICD-10-CM | POA: Diagnosis not present

## 2018-11-22 DIAGNOSIS — E782 Mixed hyperlipidemia: Secondary | ICD-10-CM | POA: Diagnosis not present

## 2018-11-22 DIAGNOSIS — R7301 Impaired fasting glucose: Secondary | ICD-10-CM | POA: Diagnosis not present

## 2018-11-22 DIAGNOSIS — E559 Vitamin D deficiency, unspecified: Secondary | ICD-10-CM | POA: Diagnosis not present

## 2018-11-22 DIAGNOSIS — R42 Dizziness and giddiness: Secondary | ICD-10-CM | POA: Diagnosis not present

## 2018-11-22 DIAGNOSIS — K219 Gastro-esophageal reflux disease without esophagitis: Secondary | ICD-10-CM | POA: Diagnosis not present

## 2018-11-26 DIAGNOSIS — I700 Atherosclerosis of aorta: Secondary | ICD-10-CM | POA: Diagnosis not present

## 2018-11-26 DIAGNOSIS — Z0001 Encounter for general adult medical examination with abnormal findings: Secondary | ICD-10-CM | POA: Diagnosis not present

## 2018-11-26 DIAGNOSIS — K5730 Diverticulosis of large intestine without perforation or abscess without bleeding: Secondary | ICD-10-CM | POA: Diagnosis not present

## 2018-11-26 DIAGNOSIS — M7542 Impingement syndrome of left shoulder: Secondary | ICD-10-CM | POA: Diagnosis not present

## 2018-11-26 DIAGNOSIS — E782 Mixed hyperlipidemia: Secondary | ICD-10-CM | POA: Diagnosis not present

## 2018-11-26 DIAGNOSIS — I10 Essential (primary) hypertension: Secondary | ICD-10-CM | POA: Diagnosis not present

## 2018-11-26 DIAGNOSIS — Z6828 Body mass index (BMI) 28.0-28.9, adult: Secondary | ICD-10-CM | POA: Diagnosis not present

## 2018-11-26 DIAGNOSIS — R42 Dizziness and giddiness: Secondary | ICD-10-CM | POA: Diagnosis not present

## 2018-12-02 DIAGNOSIS — J101 Influenza due to other identified influenza virus with other respiratory manifestations: Secondary | ICD-10-CM | POA: Diagnosis not present

## 2018-12-02 DIAGNOSIS — Z6828 Body mass index (BMI) 28.0-28.9, adult: Secondary | ICD-10-CM | POA: Diagnosis not present

## 2019-02-17 DIAGNOSIS — M7542 Impingement syndrome of left shoulder: Secondary | ICD-10-CM | POA: Diagnosis not present

## 2019-02-17 DIAGNOSIS — M7501 Adhesive capsulitis of right shoulder: Secondary | ICD-10-CM | POA: Diagnosis not present

## 2019-02-17 DIAGNOSIS — M7541 Impingement syndrome of right shoulder: Secondary | ICD-10-CM | POA: Diagnosis not present

## 2019-02-17 DIAGNOSIS — Z6828 Body mass index (BMI) 28.0-28.9, adult: Secondary | ICD-10-CM | POA: Diagnosis not present

## 2019-02-26 DIAGNOSIS — M25512 Pain in left shoulder: Secondary | ICD-10-CM | POA: Diagnosis not present

## 2019-02-26 DIAGNOSIS — M25811 Other specified joint disorders, right shoulder: Secondary | ICD-10-CM | POA: Diagnosis not present

## 2019-02-26 DIAGNOSIS — M25511 Pain in right shoulder: Secondary | ICD-10-CM | POA: Diagnosis not present

## 2019-02-26 DIAGNOSIS — M7501 Adhesive capsulitis of right shoulder: Secondary | ICD-10-CM | POA: Diagnosis not present

## 2019-03-05 DIAGNOSIS — M25512 Pain in left shoulder: Secondary | ICD-10-CM | POA: Diagnosis not present

## 2019-03-05 DIAGNOSIS — M25511 Pain in right shoulder: Secondary | ICD-10-CM | POA: Diagnosis not present

## 2019-03-12 DIAGNOSIS — M25512 Pain in left shoulder: Secondary | ICD-10-CM | POA: Diagnosis not present

## 2019-03-12 DIAGNOSIS — M25511 Pain in right shoulder: Secondary | ICD-10-CM | POA: Diagnosis not present

## 2019-03-19 DIAGNOSIS — M25511 Pain in right shoulder: Secondary | ICD-10-CM | POA: Diagnosis not present

## 2019-03-19 DIAGNOSIS — M25512 Pain in left shoulder: Secondary | ICD-10-CM | POA: Diagnosis not present

## 2019-03-26 DIAGNOSIS — M25512 Pain in left shoulder: Secondary | ICD-10-CM | POA: Diagnosis not present

## 2019-03-26 DIAGNOSIS — M25511 Pain in right shoulder: Secondary | ICD-10-CM | POA: Diagnosis not present

## 2019-03-31 DIAGNOSIS — M7541 Impingement syndrome of right shoulder: Secondary | ICD-10-CM | POA: Diagnosis not present

## 2019-03-31 DIAGNOSIS — Z6828 Body mass index (BMI) 28.0-28.9, adult: Secondary | ICD-10-CM | POA: Diagnosis not present

## 2019-03-31 DIAGNOSIS — M7542 Impingement syndrome of left shoulder: Secondary | ICD-10-CM | POA: Diagnosis not present

## 2019-03-31 DIAGNOSIS — M7501 Adhesive capsulitis of right shoulder: Secondary | ICD-10-CM | POA: Diagnosis not present

## 2019-04-02 DIAGNOSIS — M25512 Pain in left shoulder: Secondary | ICD-10-CM | POA: Diagnosis not present

## 2019-04-02 DIAGNOSIS — M25511 Pain in right shoulder: Secondary | ICD-10-CM | POA: Diagnosis not present

## 2019-04-09 DIAGNOSIS — M19011 Primary osteoarthritis, right shoulder: Secondary | ICD-10-CM | POA: Diagnosis not present

## 2019-04-09 DIAGNOSIS — M25511 Pain in right shoulder: Secondary | ICD-10-CM | POA: Diagnosis not present

## 2019-04-09 DIAGNOSIS — M7542 Impingement syndrome of left shoulder: Secondary | ICD-10-CM | POA: Diagnosis not present

## 2019-04-14 DIAGNOSIS — M7501 Adhesive capsulitis of right shoulder: Secondary | ICD-10-CM | POA: Diagnosis not present

## 2019-04-14 DIAGNOSIS — M7542 Impingement syndrome of left shoulder: Secondary | ICD-10-CM | POA: Diagnosis not present

## 2019-04-14 DIAGNOSIS — M7541 Impingement syndrome of right shoulder: Secondary | ICD-10-CM | POA: Diagnosis not present

## 2019-04-14 DIAGNOSIS — Z6828 Body mass index (BMI) 28.0-28.9, adult: Secondary | ICD-10-CM | POA: Diagnosis not present

## 2019-05-05 DIAGNOSIS — Z6828 Body mass index (BMI) 28.0-28.9, adult: Secondary | ICD-10-CM | POA: Diagnosis not present

## 2019-05-05 DIAGNOSIS — M7501 Adhesive capsulitis of right shoulder: Secondary | ICD-10-CM | POA: Diagnosis not present

## 2019-05-05 DIAGNOSIS — M7541 Impingement syndrome of right shoulder: Secondary | ICD-10-CM | POA: Diagnosis not present

## 2019-05-05 DIAGNOSIS — M7542 Impingement syndrome of left shoulder: Secondary | ICD-10-CM | POA: Diagnosis not present

## 2019-05-23 DIAGNOSIS — K219 Gastro-esophageal reflux disease without esophagitis: Secondary | ICD-10-CM | POA: Diagnosis not present

## 2019-05-23 DIAGNOSIS — R7301 Impaired fasting glucose: Secondary | ICD-10-CM | POA: Diagnosis not present

## 2019-05-23 DIAGNOSIS — E782 Mixed hyperlipidemia: Secondary | ICD-10-CM | POA: Diagnosis not present

## 2019-05-23 DIAGNOSIS — K5730 Diverticulosis of large intestine without perforation or abscess without bleeding: Secondary | ICD-10-CM | POA: Diagnosis not present

## 2019-05-23 DIAGNOSIS — I10 Essential (primary) hypertension: Secondary | ICD-10-CM | POA: Diagnosis not present

## 2019-05-26 DIAGNOSIS — Z6828 Body mass index (BMI) 28.0-28.9, adult: Secondary | ICD-10-CM | POA: Diagnosis not present

## 2019-05-26 DIAGNOSIS — I10 Essential (primary) hypertension: Secondary | ICD-10-CM | POA: Diagnosis not present

## 2019-05-26 DIAGNOSIS — R7301 Impaired fasting glucose: Secondary | ICD-10-CM | POA: Diagnosis not present

## 2019-05-26 DIAGNOSIS — Z0001 Encounter for general adult medical examination with abnormal findings: Secondary | ICD-10-CM | POA: Diagnosis not present

## 2019-05-26 DIAGNOSIS — M7501 Adhesive capsulitis of right shoulder: Secondary | ICD-10-CM | POA: Diagnosis not present

## 2019-05-26 DIAGNOSIS — M7542 Impingement syndrome of left shoulder: Secondary | ICD-10-CM | POA: Diagnosis not present

## 2019-05-26 DIAGNOSIS — M7541 Impingement syndrome of right shoulder: Secondary | ICD-10-CM | POA: Diagnosis not present

## 2019-05-26 DIAGNOSIS — N401 Enlarged prostate with lower urinary tract symptoms: Secondary | ICD-10-CM | POA: Diagnosis not present

## 2019-07-23 DIAGNOSIS — Z23 Encounter for immunization: Secondary | ICD-10-CM | POA: Diagnosis not present

## 2019-08-27 DIAGNOSIS — I10 Essential (primary) hypertension: Secondary | ICD-10-CM | POA: Diagnosis not present

## 2019-08-27 DIAGNOSIS — Z1389 Encounter for screening for other disorder: Secondary | ICD-10-CM | POA: Diagnosis not present

## 2019-08-27 DIAGNOSIS — Z1331 Encounter for screening for depression: Secondary | ICD-10-CM | POA: Diagnosis not present

## 2019-08-27 DIAGNOSIS — M7542 Impingement syndrome of left shoulder: Secondary | ICD-10-CM | POA: Diagnosis not present

## 2019-08-27 DIAGNOSIS — Z6829 Body mass index (BMI) 29.0-29.9, adult: Secondary | ICD-10-CM | POA: Diagnosis not present

## 2019-08-27 DIAGNOSIS — M7541 Impingement syndrome of right shoulder: Secondary | ICD-10-CM | POA: Diagnosis not present

## 2019-08-27 DIAGNOSIS — J3089 Other allergic rhinitis: Secondary | ICD-10-CM | POA: Diagnosis not present

## 2019-08-27 DIAGNOSIS — N401 Enlarged prostate with lower urinary tract symptoms: Secondary | ICD-10-CM | POA: Diagnosis not present

## 2019-09-01 DIAGNOSIS — I10 Essential (primary) hypertension: Secondary | ICD-10-CM | POA: Diagnosis not present

## 2019-09-01 DIAGNOSIS — E782 Mixed hyperlipidemia: Secondary | ICD-10-CM | POA: Diagnosis not present

## 2019-09-12 ENCOUNTER — Encounter (HOSPITAL_COMMUNITY): Payer: Self-pay | Admitting: *Deleted

## 2019-09-12 ENCOUNTER — Observation Stay (HOSPITAL_COMMUNITY)
Admission: EM | Admit: 2019-09-12 | Discharge: 2019-09-13 | Disposition: A | Discharge status: Home / Self Care | Payer: Medicare PPO | Attending: Family Medicine | Admitting: Family Medicine

## 2019-09-12 ENCOUNTER — Emergency Department (HOSPITAL_COMMUNITY): Payer: Medicare PPO

## 2019-09-12 ENCOUNTER — Other Ambulatory Visit: Payer: Self-pay

## 2019-09-12 DIAGNOSIS — E119 Type 2 diabetes mellitus without complications: Secondary | ICD-10-CM | POA: Diagnosis not present

## 2019-09-12 DIAGNOSIS — K8020 Calculus of gallbladder without cholecystitis without obstruction: Secondary | ICD-10-CM | POA: Diagnosis not present

## 2019-09-12 DIAGNOSIS — Z79899 Other long term (current) drug therapy: Secondary | ICD-10-CM | POA: Diagnosis not present

## 2019-09-12 DIAGNOSIS — R1011 Right upper quadrant pain: Secondary | ICD-10-CM | POA: Diagnosis present

## 2019-09-12 DIAGNOSIS — I10 Essential (primary) hypertension: Secondary | ICD-10-CM | POA: Diagnosis not present

## 2019-09-12 DIAGNOSIS — R1031 Right lower quadrant pain: Secondary | ICD-10-CM | POA: Insufficient documentation

## 2019-09-12 DIAGNOSIS — K8000 Calculus of gallbladder with acute cholecystitis without obstruction: Secondary | ICD-10-CM

## 2019-09-12 DIAGNOSIS — R109 Unspecified abdominal pain: Secondary | ICD-10-CM | POA: Diagnosis present

## 2019-09-12 DIAGNOSIS — Z880 Allergy status to penicillin: Secondary | ICD-10-CM | POA: Diagnosis not present

## 2019-09-12 DIAGNOSIS — K8066 Calculus of gallbladder and bile duct with acute and chronic cholecystitis without obstruction: Secondary | ICD-10-CM | POA: Diagnosis not present

## 2019-09-12 DIAGNOSIS — Z7984 Long term (current) use of oral hypoglycemic drugs: Secondary | ICD-10-CM | POA: Insufficient documentation

## 2019-09-12 DIAGNOSIS — E782 Mixed hyperlipidemia: Secondary | ICD-10-CM | POA: Diagnosis not present

## 2019-09-12 DIAGNOSIS — Z20828 Contact with and (suspected) exposure to other viral communicable diseases: Secondary | ICD-10-CM | POA: Diagnosis not present

## 2019-09-12 DIAGNOSIS — Z03818 Encounter for observation for suspected exposure to other biological agents ruled out: Secondary | ICD-10-CM | POA: Diagnosis not present

## 2019-09-12 DIAGNOSIS — D72829 Elevated white blood cell count, unspecified: Secondary | ICD-10-CM | POA: Diagnosis not present

## 2019-09-12 HISTORY — DX: Type 2 diabetes mellitus without complications: E11.9

## 2019-09-12 HISTORY — DX: Essential (primary) hypertension: I10

## 2019-09-12 LAB — URINALYSIS, ROUTINE W REFLEX MICROSCOPIC
Bacteria, UA: NONE SEEN
Bilirubin Urine: NEGATIVE
Glucose, UA: NEGATIVE mg/dL
Ketones, ur: 5 mg/dL — AB
Leukocytes,Ua: NEGATIVE
Nitrite: NEGATIVE
Protein, ur: NEGATIVE mg/dL
Specific Gravity, Urine: 1.01 (ref 1.005–1.030)
pH: 6 (ref 5.0–8.0)

## 2019-09-12 LAB — CBC WITH DIFFERENTIAL/PLATELET
Abs Immature Granulocytes: 0.1 10*3/uL — ABNORMAL HIGH (ref 0.00–0.07)
Basophils Absolute: 0 10*3/uL (ref 0.0–0.1)
Basophils Relative: 0 %
Eosinophils Absolute: 0.1 10*3/uL (ref 0.0–0.5)
Eosinophils Relative: 0 %
HCT: 48.3 % (ref 39.0–52.0)
Hemoglobin: 15.5 g/dL (ref 13.0–17.0)
Immature Granulocytes: 1 %
Lymphocytes Relative: 15 %
Lymphs Abs: 2.5 10*3/uL (ref 0.7–4.0)
MCH: 28.8 pg (ref 26.0–34.0)
MCHC: 32.1 g/dL (ref 30.0–36.0)
MCV: 89.6 fL (ref 80.0–100.0)
Monocytes Absolute: 1.6 10*3/uL — ABNORMAL HIGH (ref 0.1–1.0)
Monocytes Relative: 9 %
Neutro Abs: 12.9 10*3/uL — ABNORMAL HIGH (ref 1.7–7.7)
Neutrophils Relative %: 75 %
Platelets: 189 10*3/uL (ref 150–400)
RBC: 5.39 MIL/uL (ref 4.22–5.81)
RDW: 12.6 % (ref 11.5–15.5)
WBC: 17.2 10*3/uL — ABNORMAL HIGH (ref 4.0–10.5)
nRBC: 0 % (ref 0.0–0.2)

## 2019-09-12 LAB — COMPREHENSIVE METABOLIC PANEL
ALT: 50 U/L — ABNORMAL HIGH (ref 0–44)
AST: 43 U/L — ABNORMAL HIGH (ref 15–41)
Albumin: 3.7 g/dL (ref 3.5–5.0)
Alkaline Phosphatase: 71 U/L (ref 38–126)
Anion gap: 10 (ref 5–15)
BUN: 24 mg/dL — ABNORMAL HIGH (ref 8–23)
CO2: 27 mmol/L (ref 22–32)
Calcium: 8.9 mg/dL (ref 8.9–10.3)
Chloride: 95 mmol/L — ABNORMAL LOW (ref 98–111)
Creatinine, Ser: 1.27 mg/dL — ABNORMAL HIGH (ref 0.61–1.24)
GFR calc Af Amer: >60 mL/min (ref >60)
GFR calc non Af Amer: 54 mL/min — ABNORMAL LOW (ref >60)
Glucose, Bld: 102 mg/dL — ABNORMAL HIGH (ref 70–99)
Potassium: 5.4 mmol/L — ABNORMAL HIGH (ref 3.5–5.1)
Sodium: 132 mmol/L — ABNORMAL LOW (ref 135–145)
Total Bilirubin: 1.6 mg/dL — ABNORMAL HIGH (ref 0.3–1.2)
Total Protein: 7.3 g/dL (ref 6.5–8.1)

## 2019-09-12 LAB — GLUCOSE, CAPILLARY: Glucose-Capillary: 79 mg/dL (ref 70–99)

## 2019-09-12 LAB — RESPIRATORY PANEL BY RT PCR (FLU A&B, COVID)
Influenza A by PCR: NEGATIVE
Influenza B by PCR: NEGATIVE
SARS Coronavirus 2 by RT PCR: NEGATIVE

## 2019-09-12 MED ORDER — ONDANSETRON HCL 4 MG PO TABS: 4.0000 mg | ORAL_TABLET | Freq: Four times a day (QID) | ORAL | Status: DC | PRN

## 2019-09-12 MED ORDER — TAMSULOSIN HCL 0.4 MG PO CAPS
0.4000 mg | ORAL_CAPSULE | Freq: Every day | ORAL | Status: DC
  Administered 2019-09-12 – 2019-09-13 (×2): 0.4 mg via ORAL
  Filled 2019-09-12 (×2): qty 1

## 2019-09-12 MED ORDER — PANTOPRAZOLE SODIUM 40 MG PO TBEC
40.0000 mg | DELAYED_RELEASE_TABLET | Freq: Every day | ORAL | Status: DC
  Administered 2019-09-12 – 2019-09-13 (×2): 40 mg via ORAL
  Filled 2019-09-12 (×2): qty 1

## 2019-09-12 MED ORDER — POLYETHYLENE GLYCOL 3350 17 G PO PACK: 17.0000 g | PACK | Freq: Every day | ORAL | Status: DC | PRN

## 2019-09-12 MED ORDER — MORPHINE SULFATE (PF) 2 MG/ML IV SOLN: 2.0000 mg | INTRAVENOUS | Status: DC | PRN

## 2019-09-12 MED ORDER — CIPROFLOXACIN IN D5W 400 MG/200ML IV SOLN
400.0000 mg | Freq: Once | INTRAVENOUS | Status: AC
  Administered 2019-09-12: 400 mg via INTRAVENOUS
  Filled 2019-09-12: qty 200

## 2019-09-12 MED ORDER — ACETAMINOPHEN 650 MG RE SUPP: 650.0000 mg | Freq: Four times a day (QID) | RECTAL | Status: DC | PRN

## 2019-09-12 MED ORDER — SODIUM CHLORIDE 0.9 % IV BOLUS
500.0000 mL | Freq: Once | INTRAVENOUS | Status: AC
  Administered 2019-09-12: 11:00:00 500 mL via INTRAVENOUS

## 2019-09-12 MED ORDER — ATORVASTATIN CALCIUM 20 MG PO TABS
20.0000 mg | ORAL_TABLET | Freq: Every day | ORAL | Status: DC
  Administered 2019-09-12 – 2019-09-13 (×2): 20 mg via ORAL
  Filled 2019-09-12 (×2): qty 1

## 2019-09-12 MED ORDER — METOPROLOL TARTRATE 50 MG PO TABS
50.0000 mg | ORAL_TABLET | Freq: Two times a day (BID) | ORAL | Status: DC
  Administered 2019-09-12 – 2019-09-13 (×2): 50 mg via ORAL
  Filled 2019-09-12 (×2): qty 1

## 2019-09-12 MED ORDER — ONDANSETRON HCL 4 MG/2ML IJ SOLN: 4.0000 mg | Freq: Four times a day (QID) | INTRAMUSCULAR | Status: DC | PRN

## 2019-09-12 MED ORDER — METRONIDAZOLE IN NACL 5-0.79 MG/ML-% IV SOLN
500.0000 mg | Freq: Once | INTRAVENOUS | Status: AC
  Administered 2019-09-12: 500 mg via INTRAVENOUS
  Filled 2019-09-12: qty 100

## 2019-09-12 MED ORDER — SODIUM CHLORIDE 0.9 % IV SOLN
INTRAVENOUS | Status: AC
  Administered 2019-09-12: 21:00:00 via INTRAVENOUS

## 2019-09-12 MED ORDER — INSULIN ASPART 100 UNIT/ML ~~LOC~~ SOLN
0.0000 [IU] | SUBCUTANEOUS | Status: DC
  Administered 2019-09-13 (×2): 1 [IU] via SUBCUTANEOUS

## 2019-09-12 MED ORDER — ACETAMINOPHEN 325 MG PO TABS
650.0000 mg | ORAL_TABLET | Freq: Four times a day (QID) | ORAL | Status: DC | PRN
  Administered 2019-09-12 – 2019-09-13 (×2): 650 mg via ORAL
  Filled 2019-09-12 (×2): qty 2

## 2019-09-12 NOTE — H&P (Signed)
History and Physical    Leonard Flores PNT:[id number] DOB: 25-Feb-1941 DOA: 09/12/2019  PCP: Curlene Labrum, MD   Patient coming from: Home  I have personally briefly reviewed patient's old medical records in [place]  Chief Complaint: Abdominal Pain  HPI: Leonard Flores is a 78 y.o. male with medical history significant for diabetes mellitus, hypertension, dyslipidemia.  Patient presented to the ED with complaints of right-sided abdominal pain of 3 days duration.  Pain starts in his right upper abdomen radiates down to his groin.  He is unaware of any relationship to food intake in the intensity of his pain.  No fever no chills.  No vomiting no loose stools. Patient's wife happens to also be in the ED today at this time with chest pain.  ED Course: Stable vitals.  WBC 17.2.  Mild liver enzyme elevation AST 43, ALT 50, ALP 71, T bili 1.6.  CT renal stone study negative for urinary calculus or obstructive uropathy.  Nonspecific right perinephric stranding increased since 2017 but mild.  Cholelithiasis without CT evidence of acute cholecystitis.  Subsequent abdominal ultrasound cholelithiasis without sonographic evidence of acute cholecystitis.  Started on IV ciprofloxacin and metronidazole in the ED.  EDP talked with general surgery Dr. Arnoldo Morale will see patient.  Review of Systems: As per HPI all other systems reviewed and negative.  Past Medical History:  Diagnosis Date  . Diabetes mellitus without complication ([place])   . Elevated cholesterol   . Hypertension     Past Surgical History:  Procedure Laterality Date  . BACK SURGERY    . TUMOR REMOVAL       reports that he has never smoked. He has never used smokeless tobacco. He reports that he does not drink alcohol or use drugs.  Allergies  Allergen Reactions  . Penicillins Itching    Did it involve swelling of the face/tongue/throat, SOB, or low BP? No Did it involve sudden or severe rash/hives, skin peeling, or  any reaction on the inside of your mouth or nose? Yes Did you need to seek medical attention at a hospital or doctor's office? No When did it last happen?Over 10 years ago If all above answers are "NO", may proceed with cephalosporin use.    No family history on file.   Prior to Admission medications   Medication Sig Start Date End Date Taking? Authorizing Provider  atorvastatin (LIPITOR) 20 MG tablet Take 20 mg by mouth daily. 10/01/13  Yes [provider]  ibuprofen (ADVIL,MOTRIN) 200 MG tablet Take 600 mg by mouth every 6 (six) hours as needed.   Yes [provider]  metFORMIN (GLUCOPHAGE) 500 MG tablet Take 500 mg by mouth 2 (two) times daily. 09/05/19  Yes [provider]  metoprolol (LOPRESSOR) 50 MG tablet Take 50 mg by mouth 2 (two) times daily.   Yes [provider]  omeprazole (PRILOSEC) 40 MG capsule Take 40 mg by mouth daily.   Yes [provider]  tamsulosin (FLOMAX) 0.4 MG CAPS capsule Take 0.4 mg by mouth daily. 08/27/19  Yes [provider]  traMADol (ULTRAM) 50 MG tablet Take 50 mg by mouth 2 (two) times daily as needed. 04/02/19  Yes [provider]  dicyclomine (BENTYL) 20 MG tablet Take one every 6 hours for abdominal cramps Patient not taking: Reported on 09/12/2019 10/26/13   Milton Ferguson, MD  ondansetron (ZOFRAN ODT) 4 MG disintegrating tablet 4mg  ODT q4 hours prn nausea/vomit Patient not taking: Reported on 09/12/2019 10/26/13  Bethann BerkshireZammit, Joseph, MD    Physical Exam: Vitals:   09/12/19 1050 09/12/19 1052  BP: 128/60   Pulse: 77   Resp: 18   Temp: 98.8 F (37.1 C)   TempSrc: Oral   SpO2: 98%   Weight:  95.3 kg  Height:  6' (1.829 m)    Constitutional: NAD, calm, comfortable Vitals:   09/12/19 1050 09/12/19 1052  BP: 128/60   Pulse: 77   Resp: 18   Temp: 98.8 F (37.1 C)   TempSrc: Oral   SpO2: 98%   Weight:  95.3 kg  Height:  6' (1.829 m)   Eyes: PERRL, lids and conjunctivae  normal ENMT: Mucous membranes are moist. Posterior pharynx clear of any exudate or lesions.  Neck: normal, supple, no masses, no thyromegaly Respiratory: clear to auscultation bilaterally, no wheezing, no crackles. Normal respiratory effort. No accessory muscle use.  Cardiovascular: Regular rate and rhythm, no murmurs / rubs / gallops. No extremity edema. 2+ pedal pulses.  Abdomen: Minimal right sided abdominal tenderness, abdomen soft, no masses palpated. No hepatosplenomegaly. Bowel sounds positive.  Musculoskeletal: no clubbing / cyanosis. No joint deformity upper and lower extremities. Good ROM, no contractures. Normal muscle tone.  Skin: no rashes, lesions, ulcers. No induration Neurologic: CN 2-12 grossly intact.Strength 5/5 in all 4.  Psychiatric: Normal judgment and insight. Alert and oriented x 3. Normal mood.   Labs on Admission: I have personally reviewed following labs and imaging studies  CBC: Recent Labs  Lab 09/12/19 1121  WBC 17.2*  NEUTROABS 12.9*  HGB 15.5  HCT 48.3  MCV 89.6  PLT 189   Basic Metabolic Panel: Recent Labs  Lab 09/12/19 1121  NA 132*  K 5.4*  CL 95*  CO2 27  GLUCOSE 102*  BUN 24*  CREATININE 1.27*  CALCIUM 8.9   Liver Function Tests: Recent Labs  Lab 09/12/19 1121  AST 43*  ALT 50*  ALKPHOS 71  BILITOT 1.6*  PROT 7.3  ALBUMIN 3.7     Radiological Exams on Admission: US Abdomen Complete  Result Date: 09/12/2019 CLINICAL DATA:  Right-sided abdominal pain, possible cholecystitis EXAM: ABDOMEN ULTRASOUND COMPLETE COMPARISON:  Same-day renal colic CT, CT abdomen pelvis 06/12/2016 FINDINGS: Gallbladder: Multiple echogenic, posteriorly shadowing gallstones are present in the gallbladder, largest measuring up to 1.2 cm in maximal dimension. Sonographic Eulah PontMurphy sign is reportedly negative. No gallbladder wall thickening or pericholecystic fluid. Common bile duct: Diameter: 4.2 mm, nondilated Liver: A tiny hepatic cyst seen on CT imaging is  not well visualized on the sonographic evaluation of the liver. Diffusely increased hepatic echogenicity with loss of definition of the portal triads and diminished posterior through transmission compatible with hepatic steatosis. Portal vein is patent on color Doppler imaging with normal direction of blood flow towards the liver. IVC: No abnormality visualized. Pancreas: Poorly visualized due to bowel gas. Spleen: Poorly visualized due to bowel gas. Right Kidney: Length: 12.1 cm. Echogenicity within normal limits. No mass or hydronephrosis visualized. Left Kidney: Length: 10.6 cm. Echogenicity within normal limits. No mass or hydronephrosis visualized. Abdominal aorta: Distal aorta is unremarkable with a diameter 1.9 cm. Proximal extent is not visualized due to bowel gas. Other findings: None. IMPRESSION: Cholelithiasis without sonographic evidence of acute cholecystitis. Difficult visualization of some abdominal viscera due to extensive bowel gas including the pancreas, spleen and proximal aorta. Mild hepatic steatosis. Tiny hepatic cyst seen on CT is not well visualized on this exam. Electronically Signed   By: Kreg ShropshirePrice  DeHay M.D.   On:  09/12/2019 14:48   CT Renal Stone Study  Result Date: 09/12/2019 CLINICAL DATA:  78 year old male with right flank pain radiating to the groin for 4 days. EXAM: CT ABDOMEN AND PELVIS WITHOUT CONTRAST TECHNIQUE: Multidetector CT imaging of the abdomen and pelvis was performed following the standard protocol without IV contrast. COMPARISON:  CT Abdomen and Pelvis 06/12/2016. FINDINGS: Lower chest: Stable elevation of the left hemidiaphragm. Chronic lung base scarring and/or atelectasis has not significantly changed. No pleural or pericardial effusion. Calcified coronary artery and aortic atherosclerosis. Hepatobiliary: Stable since 2017; tiny low-density area in the ventral right hepatic lobe near the surface of the liver on series 2, image 20 is stable and appears benign.  Evidence of small dependent gallstones on series 2, image 29, but no pericholecystic inflammation. No bile duct enlargement. Pancreas: Negative. Spleen: Negative. Adrenals/Urinary Tract: Normal adrenal glands. Noncontrast kidneys with no nephrolithiasis or hydronephrosis. However, there is new asymmetric right perinephric stranding post apparent at the lower pole on coronal image 64. But the right ureter is also decompressed and without stranding. No urinary calculus. Normal left ureter also. Urinary bladder is nondilated. The right fundus of the bladder is mildly herniated into the mostly fat containing right inguinal hernia on sagittal image 59, although has not significantly changed since 2017. No other perivesical stranding. Prostatomegaly. Stomach/Bowel: Negative rectum. Redundant sigmoid colon with occasional diverticula. Decompressed and negative descending colon. Gas-filled transverse colon and gas and retained stool in the right colon which are nondilated. Normal appendix on series 2, image 77. Negative terminal ileum. No dilated small bowel. Decompressed stomach and duodenum. No free air, free fluid. Vascular/Lymphatic: Vascular patency is not evaluated in the absence of IV contrast. Aortoiliac calcified atherosclerosis. No lymphadenopathy. Reproductive: Chronic fat containing inguinal hernias are stable since 2017, that on the right affecting the bladder fundus is further described above. Prostatomegaly. Otherwise negative. Other: No pelvic free fluid. Musculoskeletal: Stable, with frequent mild endplate deformities in the lower thoracic and the lumbar spine. No acute osseous abnormality identified. IMPRESSION: 1. No urinary calculus or obstructive uropathy. Nonspecific right perinephric stranding has increased since 2017 but is mild - such as can be seen with renal insufficiency and other intrinsic renal disease. There are chronic mostly fat containing inguinal hernias, although the right hernia also  contains a small portion of the bladder fundus (sagittal image 59) - but this has not significantly changed. 2. Cholelithiasis without CT evidence of acute cholecystitis. If there is right upper quadrant pain then ultrasound would be complementary. 3. No other acute or inflammatory process.  Normal appendix. 4.  Aortic Atherosclerosis (ICD10-I70.0). Electronically Signed   By: Odessa Fleming M.D.   On: 09/12/2019 13:18    EKG: None  Assessment/Plan Active Problems:   Abdominal pain   Right sided abdominal pain- mildly elevated liver enzymes AST 43 ALT 50, ALP 71, T bili 1.6.  WBC 17.2.  Afebrile.  Abdominal CT abdominal ultrasound showed cholelithiasis without sonographic evidence of acute cholecystitis.  IV ciprofloxacin and metronidazole given in ED. -Holding off on further antibiotics at this time -Follow-up on general surgery recommendations -N.p.o. midnight -IV morphine 2 mg prn -CMP, CBC a.m. - N/s 100cc/hr x 15 hrs  Mild hyponatremia-sodium 132 -Hydrate  Hypertension, BPH-stable. -Resume home metoprolol, tamsulosin  Diabetes mellitus-random glucose 102. - SSI - Hgba1c -Hold home Metformin.  DVT prophylaxis: SCDs Code Status: Full code Family Communication: None at bedside  disposition Plan: Pending surgical evaluation and management Consults called: General surgery Admission status: Observation,  MedSurg   Onnie Boer MD [facility]  09/12/2019, 5:00 PM

## 2019-09-12 NOTE — ED Provider Notes (Signed)
[facility]GENCY DEPARTMENT Provider Note   CSN: [id number] Arrival date & time: 09/12/19  1007     History Chief Complaint  Patient presents with  . Abdominal Pain    Leonard Flores is a 78 y.o. male.  HPI    Patient presents with concern of flank pain. Onset was about 3 days ago, sudden.  Since onset pain has been persistent, in the right flank, sharp, moderate, radiating towards right inguinal crease. No new dysuria, no hematuria. There is transient improvement with tramadol No other abdominal pain, no chest pain, no dyspnea, no fever, chills, nausea, vomiting.  Past Medical History:  Diagnosis Date  . Diabetes mellitus without complication (HCC)   . Elevated cholesterol   . Hypertension     There are no problems to display for this patient.   Past Surgical History:  Procedure Laterality Date  . BACK SURGERY    . TUMOR REMOVAL         No family history on file.  Social History   Tobacco Use  . Smoking status: Never Smoker  . Smokeless tobacco: Never Used  Substance Use Topics  . Alcohol use: No  . Drug use: No    Home Medications Prior to Admission medications   Medication Sig Start Date End Date Taking? Authorizing Provider  atorvastatin (LIPITOR) 20 MG tablet Take 20 mg by mouth daily. 10/01/13  Yes [provider]  ibuprofen (ADVIL,MOTRIN) 200 MG tablet Take 600 mg by mouth every 6 (six) hours as needed.   Yes [provider]  metFORMIN (GLUCOPHAGE) 500 MG tablet Take 500 mg by mouth 2 (two) times daily. 09/05/19  Yes [provider]  metoprolol (LOPRESSOR) 50 MG tablet Take 50 mg by mouth 2 (two) times daily.   Yes [provider]  omeprazole (PRILOSEC) 40 MG capsule Take 40 mg by mouth daily.   Yes [provider]  tamsulosin (FLOMAX) 0.4 MG CAPS capsule Take 0.4 mg by mouth daily. 08/27/19  Yes [provider]  traMADol (ULTRAM) 50 MG tablet Take 50 mg by mouth 2 (two) times daily as  needed. 04/02/19  Yes [provider]  dicyclomine (BENTYL) 20 MG tablet Take one every 6 hours for abdominal cramps Patient not taking: Reported on 09/12/2019 10/26/13   Bethann BerkshireZammit, Joseph, MD  ondansetron (ZOFRAN ODT) 4 MG disintegrating tablet 4mg  ODT q4 hours prn nausea/vomit Patient not taking: Reported on 09/12/2019 10/26/13   Bethann BerkshireZammit, Joseph, MD    Allergies    Penicillins  Review of Systems   Review of Systems  Constitutional:       Per HPI, otherwise negative  HENT:       Per HPI, otherwise negative  Respiratory:       Per HPI, otherwise negative  Cardiovascular:       Per HPI, otherwise negative  Gastrointestinal: Negative for vomiting.  Endocrine:       Negative aside from HPI  Genitourinary:       Neg aside from HPI   Musculoskeletal:       Per HPI, otherwise negative  Skin: Negative.   Neurological: Negative for syncope.    Physical Exam Updated Vital Signs BP 128/60 (BP Location: Right Arm)   Pulse 77   Temp 98.8 F (37.1 C) (Oral)   Resp 18   Ht 6' (1.829 m)   Wt 95.3 kg   SpO2 98%   BMI 28.48 kg/m   Physical Exam Vitals and nursing note reviewed.  Constitutional:  General: He is not in acute distress.    Appearance: He is well-developed.  HENT:     Head: Normocephalic and atraumatic.  Eyes:     Conjunctiva/sclera: Conjunctivae normal.  Cardiovascular:     Rate and Rhythm: Normal rate and regular rhythm.  Pulmonary:     Effort: Pulmonary effort is normal. No respiratory distress.     Breath sounds: No stridor.  Abdominal:     General: There is no distension.     Tenderness: There is no abdominal tenderness. There is no guarding.  Skin:    General: Skin is warm and dry.  Neurological:     Mental Status: He is alert and oriented to person, place, and time.     ED Results / Procedures / Treatments   Labs (all labs ordered are listed, but only abnormal results are displayed) Labs Reviewed  COMPREHENSIVE METABOLIC PANEL - Abnormal;  Notable for the following components:      Result Value   Sodium 132 (*)    Potassium 5.4 (*)    Chloride 95 (*)    Glucose, Bld 102 (*)    BUN 24 (*)    Creatinine, Ser 1.27 (*)    AST 43 (*)    ALT 50 (*)    Total Bilirubin 1.6 (*)    GFR calc non Af Amer 54 (*)    All other components within normal limits  CBC WITH DIFFERENTIAL/PLATELET - Abnormal; Notable for the following components:   WBC 17.2 (*)    Neutro Abs 12.9 (*)    Monocytes Absolute 1.6 (*)    Abs Immature Granulocytes 0.10 (*)    All other components within normal limits  RESPIRATORY PANEL BY RT PCR (FLU A&B, COVID)  URINALYSIS, ROUTINE W REFLEX MICROSCOPIC    EKG None  Radiology US Abdomen Complete  Result Date: 09/12/2019 CLINICAL DATA:  Right-sided abdominal pain, possible cholecystitis EXAM: ABDOMEN ULTRASOUND COMPLETE COMPARISON:  Same-day renal colic CT, CT abdomen pelvis 06/12/2016 FINDINGS: Gallbladder: Multiple echogenic, posteriorly shadowing gallstones are present in the gallbladder, largest measuring up to 1.2 cm in maximal dimension. Sonographic Eulah Pont sign is reportedly negative. No gallbladder wall thickening or pericholecystic fluid. Common bile duct: Diameter: 4.2 mm, nondilated Liver: A tiny hepatic cyst seen on CT imaging is not well visualized on the sonographic evaluation of the liver. Diffusely increased hepatic echogenicity with loss of definition of the portal triads and diminished posterior through transmission compatible with hepatic steatosis. Portal vein is patent on color Doppler imaging with normal direction of blood flow towards the liver. IVC: No abnormality visualized. Pancreas: Poorly visualized due to bowel gas. Spleen: Poorly visualized due to bowel gas. Right Kidney: Length: 12.1 cm. Echogenicity within normal limits. No mass or hydronephrosis visualized. Left Kidney: Length: 10.6 cm. Echogenicity within normal limits. No mass or hydronephrosis visualized. Abdominal aorta: Distal  aorta is unremarkable with a diameter 1.9 cm. Proximal extent is not visualized due to bowel gas. Other findings: None. IMPRESSION: Cholelithiasis without sonographic evidence of acute cholecystitis. Difficult visualization of some abdominal viscera due to extensive bowel gas including the pancreas, spleen and proximal aorta. Mild hepatic steatosis. Tiny hepatic cyst seen on CT is not well visualized on this exam. Electronically Signed   By: Kreg Shropshire M.D.   On: 09/12/2019 14:48   CT Renal Stone Study  Result Date: 09/12/2019 CLINICAL DATA:  78 year old male with right flank pain radiating to the groin for 4 days. EXAM: CT ABDOMEN AND PELVIS WITHOUT  CONTRAST TECHNIQUE: Multidetector CT imaging of the abdomen and pelvis was performed following the standard protocol without IV contrast. COMPARISON:  CT Abdomen and Pelvis 06/12/2016. FINDINGS: Lower chest: Stable elevation of the left hemidiaphragm. Chronic lung base scarring and/or atelectasis has not significantly changed. No pleural or pericardial effusion. Calcified coronary artery and aortic atherosclerosis. Hepatobiliary: Stable since 2017; tiny low-density area in the ventral right hepatic lobe near the surface of the liver on series 2, image 20 is stable and appears benign. Evidence of small dependent gallstones on series 2, image 29, but no pericholecystic inflammation. No bile duct enlargement. Pancreas: Negative. Spleen: Negative. Adrenals/Urinary Tract: Normal adrenal glands. Noncontrast kidneys with no nephrolithiasis or hydronephrosis. However, there is new asymmetric right perinephric stranding post apparent at the lower pole on coronal image 64. But the right ureter is also decompressed and without stranding. No urinary calculus. Normal left ureter also. Urinary bladder is nondilated. The right fundus of the bladder is mildly herniated into the mostly fat containing right inguinal hernia on sagittal image 59, although has not significantly  changed since 2017. No other perivesical stranding. Prostatomegaly. Stomach/Bowel: Negative rectum. Redundant sigmoid colon with occasional diverticula. Decompressed and negative descending colon. Gas-filled transverse colon and gas and retained stool in the right colon which are nondilated. Normal appendix on series 2, image 77. Negative terminal ileum. No dilated small bowel. Decompressed stomach and duodenum. No free air, free fluid. Vascular/Lymphatic: Vascular patency is not evaluated in the absence of IV contrast. Aortoiliac calcified atherosclerosis. No lymphadenopathy. Reproductive: Chronic fat containing inguinal hernias are stable since 2017, that on the right affecting the bladder fundus is further described above. Prostatomegaly. Otherwise negative. Other: No pelvic free fluid. Musculoskeletal: Stable, with frequent mild endplate deformities in the lower thoracic and the lumbar spine. No acute osseous abnormality identified. IMPRESSION: 1. No urinary calculus or obstructive uropathy. Nonspecific right perinephric stranding has increased since 2017 but is mild - such as can be seen with renal insufficiency and other intrinsic renal disease. There are chronic mostly fat containing inguinal hernias, although the right hernia also contains a small portion of the bladder fundus (sagittal image 59) - but this has not significantly changed. 2. Cholelithiasis without CT evidence of acute cholecystitis. If there is right upper quadrant pain then ultrasound would be complementary. 3. No other acute or inflammatory process.  Normal appendix. 4.  Aortic Atherosclerosis (ICD10-I70.0). Electronically Signed   By: Genevie Ann M.D.   On: 09/12/2019 13:18    Procedures Procedures (including critical care time)  Medications Ordered in ED Medications  ciprofloxacin (CIPRO) IVPB 400 mg (has no administration in time range)  metroNIDAZOLE (FLAGYL) IVPB 500 mg (has no administration in time range)  sodium chloride 0.9 %  bolus 500 mL (0 mLs Intravenous Stopped 09/12/19 1237)    ED Course  I have reviewed the triage vital signs and the nursing notes.  Pertinent labs & imaging results that were available during my care of the patient were reviewed by me and considered in my medical decision making (see chart for details).    MDM Rules/Calculators/A&P     CHA2DS2/VAS Stroke Risk Points      N/A >= 2 Points: High Risk  1 - 1.99 Points: Medium Risk  0 Points: Low Risk    A final score could not be computed because of missing components.: Last  Change: N/A     This score determines the patient's risk of having a stroke if the  patient has atrial fibrillation.  This score is not applicable to this patient. Components are not  calculated.                  On repeat exam the patient is in no distress per Initial findings reviewed, notable for hernia, labs concerning for elevated liver function panel, hyperbilirubinemia, mild increase in creatinine, and leukocytosis. With concern for possible cholecystitis, the patient was ultrasound performed.   3:17 PM Patient was unaware of hernia, but today the hernia appears to be in no remarkably different position and previous imaging suggest. However, the patient is found to have evidence for gallstones on exam, and with elevated LFT, leukocytosis I discussed his case with our surgical team. On being informed of the abnormal ultrasound, the patient now notes that his pain started in the right subcostal region before becoming diffuse across the right side.  He also notes that he has previously been told of gallstones, but has never seen a Careers adviser.  This elderly male presents with right-sided pain.  Patient found a leukocytosis, slight electrolyte abnormalities and hyperbilirubinemia Ultrasound concerning for cholelithiasis, and with concern for symptomatic etiology versus acute cholecystitis, the patient was admitted for further monitoring, management. Patient  started on ciprofloxacin, Flagyl due to his allergy profile.  Covid test pending on admission. Final Clinical Impression(s) / ED Diagnoses Final diagnoses:  Calculus of gallbladder with acute cholecystitis without obstruction      Gerhard Munch, MD 09/12/19 1519

## 2019-09-12 NOTE — ED Triage Notes (Signed)
Pt c/o right lower abdominal pain that radiates to his groin; pt states the pain started x 3 days ago; pt denies any urinary sx

## 2019-09-13 ENCOUNTER — Encounter (HOSPITAL_COMMUNITY): Payer: Self-pay | Admitting: Internal Medicine

## 2019-09-13 DIAGNOSIS — I10 Essential (primary) hypertension: Secondary | ICD-10-CM | POA: Diagnosis present

## 2019-09-13 DIAGNOSIS — K8000 Calculus of gallbladder with acute cholecystitis without obstruction: Secondary | ICD-10-CM

## 2019-09-13 DIAGNOSIS — R1011 Right upper quadrant pain: Secondary | ICD-10-CM | POA: Diagnosis not present

## 2019-09-13 DIAGNOSIS — E119 Type 2 diabetes mellitus without complications: Secondary | ICD-10-CM | POA: Diagnosis not present

## 2019-09-13 DIAGNOSIS — D72829 Elevated white blood cell count, unspecified: Secondary | ICD-10-CM | POA: Diagnosis present

## 2019-09-13 LAB — CBC
HCT: 41.9 % (ref 39.0–52.0)
Hemoglobin: 13.6 g/dL (ref 13.0–17.0)
MCH: 28.6 pg (ref 26.0–34.0)
MCHC: 32.5 g/dL (ref 30.0–36.0)
MCV: 88 fL (ref 80.0–100.0)
Platelets: 174 10*3/uL (ref 150–400)
RBC: 4.76 MIL/uL (ref 4.22–5.81)
RDW: 12.2 % (ref 11.5–15.5)
WBC: 11.5 10*3/uL — ABNORMAL HIGH (ref 4.0–10.5)
nRBC: 0 % (ref 0.0–0.2)

## 2019-09-13 LAB — COMPREHENSIVE METABOLIC PANEL
ALT: 37 U/L (ref 0–44)
AST: 22 U/L (ref 15–41)
Albumin: 3.1 g/dL — ABNORMAL LOW (ref 3.5–5.0)
Alkaline Phosphatase: 57 U/L (ref 38–126)
Anion gap: 11 (ref 5–15)
BUN: 22 mg/dL (ref 8–23)
CO2: 25 mmol/L (ref 22–32)
Calcium: 8.7 mg/dL — ABNORMAL LOW (ref 8.9–10.3)
Chloride: 100 mmol/L (ref 98–111)
Creatinine, Ser: 1.1 mg/dL (ref 0.61–1.24)
GFR calc Af Amer: >60 mL/min (ref >60)
GFR calc non Af Amer: >60 mL/min (ref >60)
Glucose, Bld: 113 mg/dL — ABNORMAL HIGH (ref 70–99)
Potassium: 4.1 mmol/L (ref 3.5–5.1)
Sodium: 136 mmol/L (ref 135–145)
Total Bilirubin: 0.9 mg/dL (ref 0.3–1.2)
Total Protein: 6.2 g/dL — ABNORMAL LOW (ref 6.5–8.1)

## 2019-09-13 LAB — HEMOGLOBIN A1C
Hgb A1c MFr Bld: 6.7 % — ABNORMAL HIGH (ref 4.8–5.6)
Mean Plasma Glucose: 145.59 mg/dL

## 2019-09-13 LAB — GLUCOSE, CAPILLARY
Glucose-Capillary: 100 mg/dL — ABNORMAL HIGH (ref 70–99)
Glucose-Capillary: 123 mg/dL — ABNORMAL HIGH (ref 70–99)
Glucose-Capillary: 129 mg/dL — ABNORMAL HIGH (ref 70–99)

## 2019-09-13 MED ORDER — CIPROFLOXACIN HCL 500 MG PO TABS: 500.0000 mg | ORAL_TABLET | Freq: Two times a day (BID) | ORAL | 0 refills | Status: DC

## 2019-09-13 MED ORDER — MORPHINE SULFATE (PF) 2 MG/ML IV SOLN: 1.0000 mg | INTRAVENOUS | Status: DC | PRN

## 2019-09-13 NOTE — Consult Note (Signed)
Reason for Consult: Biliary colic, cholelithiasis Referring Physician: Dr. Tracey Harries is an 78 y.o. male.  HPI: Patient is a 78 year old white male who presented to the hospital emergency department with a 3-day history of worsening right upper quadrant and right-sided abdominal pain.  He was nauseated and had decreased appetite during that time.  He was seen in the emergency room and was found to have mild LFT elevation, leukocytosis, and cholelithiasis.  By ultrasound, he did not have cholecystitis.  It was admitted to the hospital for IV hydration and IV antibiotics.  This morning, his pain has resolved.  He is hungry.  He has 0 out of 10 abdominal pain.  Of note was the fact that his wife who came with him was admitted to the hospital for cardiac issues.  He denies any recent history of fever, chills, fatty food intolerance, or jaundice.  He also states he has a known history of cholelithiasis.  Past Medical History:  Diagnosis Date  . Diabetes mellitus without complication (HCC)   . Elevated cholesterol   . Hypertension     Past Surgical History:  Procedure Laterality Date  . BACK SURGERY    . TUMOR REMOVAL      History reviewed. No pertinent family history.  Social History:  reports that he has never smoked. He has never used smokeless tobacco. He reports that he does not drink alcohol or use drugs.  Allergies:  Allergies  Allergen Reactions  . Penicillins Itching    Did it involve swelling of the face/tongue/throat, SOB, or low BP? No Did it involve sudden or severe rash/hives, skin peeling, or any reaction on the inside of your mouth or nose? Yes Did you need to seek medical attention at a hospital or doctor's office? No When did it last happen?Over 10 years ago If all above answers are "NO", may proceed with cephalosporin use.    Medications: I have reviewed the patient's current medications.  Results for orders placed or performed during the hospital  encounter of 09/12/19 (from the past 48 hour(s))  Comprehensive metabolic panel     Status: Abnormal   Collection Time: 09/12/19 11:21 AM  Result Value Ref Range   Sodium 132 (L) 135 - 145 mmol/L   Potassium 5.4 (H) 3.5 - 5.1 mmol/L   Chloride 95 (L) 98 - 111 mmol/L   CO2 27 22 - 32 mmol/L   Glucose, Bld 102 (H) 70 - 99 mg/dL   BUN 24 (H) 8 - 23 mg/dL   Creatinine, Ser 1.61 (H) 0.61 - 1.24 mg/dL   Calcium 8.9 8.9 - 09.6 mg/dL   Total Protein 7.3 6.5 - 8.1 g/dL   Albumin 3.7 3.5 - 5.0 g/dL   AST 43 (H) 15 - 41 U/L   ALT 50 (H) 0 - 44 U/L   Alkaline Phosphatase 71 38 - 126 U/L   Total Bilirubin 1.6 (H) 0.3 - 1.2 mg/dL   GFR calc non Af Amer 54 (L) >60 mL/min   GFR calc Af Amer >60 >60 mL/min   Anion gap 10 5 - 15    Comment: Performed at [facility], [address], [place], [place] [address]  CBC with Differential     Status: Abnormal   Collection Time: 09/12/19 11:21 AM  Result Value Ref Range   WBC 17.2 (H) 4.0 - 10.5 K/uL   RBC 5.39 4.22 - 5.81 MIL/uL   Hemoglobin 15.5 13.0 - 17.0 g/dL   HCT 98.1 19.1 -  52.0 %   MCV 89.6 80.0 - 100.0 fL   MCH 28.8 26.0 - 34.0 pg   MCHC 32.1 30.0 - 36.0 g/dL   RDW 12.6 11.5 - 15.5 %   Platelets 189 150 - 400 K/uL    Comment: Immature Platelet Fraction may be clinically indicated, consider ordering this additional test [id number]    nRBC 0.0 0.0 - 0.2 %   Neutrophils Relative % 75 %   Neutro Abs 12.9 (H) 1.7 - 7.7 K/uL   Lymphocytes Relative 15 %   Lymphs Abs 2.5 0.7 - 4.0 K/uL   Monocytes Relative 9 %   Monocytes Absolute 1.6 (H) 0.1 - 1.0 K/uL   Eosinophils Relative 0 %   Eosinophils Absolute 0.1 0.0 - 0.5 K/uL   Basophils Relative 0 %   Basophils Absolute 0.0 0.0 - 0.1 K/uL   Immature Granulocytes 1 %   Abs Immature Granulocytes 0.10 (H) 0.00 - 0.07 K/uL    Comment: Performed at [facility], [address], [place], [place] [address]  Respiratory Panel by RT PCR (Flu A&B, Covid) - Nasopharyngeal Swab     Status: None    Collection Time: 09/12/19  3:05 PM   Specimen: Nasopharyngeal Swab  Result Value Ref Range   SARS Coronavirus 2 by RT PCR NEGATIVE NEGATIVE    Comment: (NOTE) SARS-CoV-2 target nucleic acids are NOT DETECTED. The SARS-CoV-2 RNA is generally detectable in upper respiratoy specimens during the acute phase of infection. The lowest concentration of SARS-CoV-2 viral copies this assay can detect is 131 copies/mL. A negative result does not preclude SARS-Cov-2 infection and should not be used as the sole basis for treatment or other patient management decisions. A negative result may occur with  improper specimen collection/handling, submission of specimen other than nasopharyngeal swab, presence of viral mutation(s) within the areas targeted by this assay, and inadequate number of viral copies (<131 copies/mL). A negative result must be combined with clinical observations, patient history, and epidemiological information. The expected result is Negative. Fact Sheet for Patients:  PinkCheek.be Fact Sheet for Healthcare Providers:  GravelBags.it This test is not yet ap proved or cleared by the [place] FDA and  has been authorized for detection and/or diagnosis of SARS-CoV-2 by FDA under an Emergency Use Authorization (EUA). This EUA will remain  in effect (meaning this test can be used) for the duration of the COVID-19 declaration under Section 564(b)(1) of the Act, 21 U.S.C. section 360bbb-3(b)(1), unless the authorization is terminated or revoked sooner.    Influenza A by PCR NEGATIVE NEGATIVE   Influenza B by PCR NEGATIVE NEGATIVE    Comment: (NOTE) The Xpert Xpress SARS-CoV-2/FLU/RSV assay is intended as an aid in  the diagnosis of influenza from Nasopharyngeal swab specimens and  should not be used as a sole basis for treatment. Nasal washings and  aspirates are unacceptable for Xpert Xpress SARS-CoV-2/FLU/RSV   testing. Fact Sheet for Patients: PinkCheek.be Fact Sheet for Healthcare Providers: GravelBags.it This test is not yet approved or cleared by the [place] FDA and  has been authorized for detection and/or diagnosis of SARS-CoV-2 by  FDA under an Emergency Use Authorization (EUA). This EUA will remain  in effect (meaning this test can be used) for the duration of the  Covid-19 declaration under Section 564(b)(1) of the Act, 21  U.S.C. section 360bbb-3(b)(1), unless the authorization is  terminated or revoked. Performed at [facility], [address], [place], [place] [address]   Urinalysis, Routine w reflex microscopic  Status: Abnormal   Collection Time: 09/12/19  4:22 PM  Result Value Ref Range   Color, Urine STRAW (A) YELLOW   APPearance CLEAR CLEAR   Specific Gravity, Urine 1.010 1.005 - 1.030   pH 6.0 5.0 - 8.0   Glucose, UA NEGATIVE NEGATIVE mg/dL   Hgb urine dipstick MODERATE (A) NEGATIVE   Bilirubin Urine NEGATIVE NEGATIVE   Ketones, ur 5 (A) NEGATIVE mg/dL   Protein, ur NEGATIVE NEGATIVE mg/dL   Nitrite NEGATIVE NEGATIVE   Leukocytes,Ua NEGATIVE NEGATIVE   RBC / HPF 0-5 0 - 5 RBC/hpf   WBC, UA 0-5 0 - 5 WBC/hpf   Bacteria, UA NONE SEEN NONE SEEN    Comment: Performed at [facility], [address], [place], [place] [address]  Glucose, capillary     Status: None   Collection Time: 09/12/19  8:32 PM  Result Value Ref Range   Glucose-Capillary 79 70 - 99 mg/dL   Comment 1 Notify RN    Comment 2 Document in Chart   Glucose, capillary     Status: Abnormal   Collection Time: 09/13/19 12:04 AM  Result Value Ref Range   Glucose-Capillary 129 (H) 70 - 99 mg/dL   Comment 1 Notify RN   Glucose, capillary     Status: Abnormal   Collection Time: 09/13/19  4:53 AM  Result Value Ref Range   Glucose-Capillary 123 (H) 70 - 99 mg/dL   Comment 1 Notify RN   Comprehensive metabolic panel     Status: Abnormal    Collection Time: 09/13/19  5:40 AM  Result Value Ref Range   Sodium 136 135 - 145 mmol/L   Potassium 4.1 3.5 - 5.1 mmol/L    Comment: DELTA CHECK NOTED   Chloride 100 98 - 111 mmol/L   CO2 25 22 - 32 mmol/L   Glucose, Bld 113 (H) 70 - 99 mg/dL   BUN 22 8 - 23 mg/dL   Creatinine, Ser 6.04 0.61 - 1.24 mg/dL   Calcium 8.7 (L) 8.9 - 10.3 mg/dL   Total Protein 6.2 (L) 6.5 - 8.1 g/dL   Albumin 3.1 (L) 3.5 - 5.0 g/dL   AST 22 15 - 41 U/L   ALT 37 0 - 44 U/L   Alkaline Phosphatase 57 38 - 126 U/L   Total Bilirubin 0.9 0.3 - 1.2 mg/dL   GFR calc non Af Amer >60 >60 mL/min   GFR calc Af Amer >60 >60 mL/min   Anion gap 11 5 - 15    Comment: Performed at [facility], [address], [place], [place] [address]  CBC     Status: Abnormal   Collection Time: 09/13/19  5:40 AM  Result Value Ref Range   WBC 11.5 (H) 4.0 - 10.5 K/uL   RBC 4.76 4.22 - 5.81 MIL/uL   Hemoglobin 13.6 13.0 - 17.0 g/dL   HCT 11.9 14.7 - 82.9 %   MCV 88.0 80.0 - 100.0 fL   MCH 28.6 26.0 - 34.0 pg   MCHC 32.5 30.0 - 36.0 g/dL   RDW 56.2 13.0 - 86.5 %   Platelets 174 150 - 400 K/uL   nRBC 0.0 0.0 - 0.2 %    Comment: Performed at [facility], [address] [place], [place] [address]  Glucose, capillary     Status: Abnormal   Collection Time: 09/13/19  7:34 AM  Result Value Ref Range   Glucose-Capillary 100 (H) 70 - 99 mg/dL    US Abdomen Complete  Result Date: 09/12/2019 CLINICAL DATA:  Right-sided abdominal pain, possible cholecystitis EXAM: ABDOMEN ULTRASOUND COMPLETE COMPARISON:  Same-day renal colic CT, CT abdomen pelvis 06/12/2016 FINDINGS: Gallbladder: Multiple echogenic, posteriorly shadowing gallstones are present in the gallbladder, largest measuring up to 1.2 cm in maximal dimension. Sonographic Eulah Pont sign is reportedly negative. No gallbladder wall thickening or pericholecystic fluid. Common bile duct: Diameter: 4.2 mm, nondilated Liver: A tiny hepatic cyst seen on CT imaging is not well visualized on the  sonographic evaluation of the liver. Diffusely increased hepatic echogenicity with loss of definition of the portal triads and diminished posterior through transmission compatible with hepatic steatosis. Portal vein is patent on color Doppler imaging with normal direction of blood flow towards the liver. IVC: No abnormality visualized. Pancreas: Poorly visualized due to bowel gas. Spleen: Poorly visualized due to bowel gas. Right Kidney: Length: 12.1 cm. Echogenicity within normal limits. No mass or hydronephrosis visualized. Left Kidney: Length: 10.6 cm. Echogenicity within normal limits. No mass or hydronephrosis visualized. Abdominal aorta: Distal aorta is unremarkable with a diameter 1.9 cm. Proximal extent is not visualized due to bowel gas. Other findings: None. IMPRESSION: Cholelithiasis without sonographic evidence of acute cholecystitis. Difficult visualization of some abdominal viscera due to extensive bowel gas including the pancreas, spleen and proximal aorta. Mild hepatic steatosis. Tiny hepatic cyst seen on CT is not well visualized on this exam. Electronically Signed   By: Kreg Shropshire M.D.   On: 09/12/2019 14:48   CT Renal Stone Study  Result Date: 09/12/2019 CLINICAL DATA:  78 year old male with right flank pain radiating to the groin for 4 days. EXAM: CT ABDOMEN AND PELVIS WITHOUT CONTRAST TECHNIQUE: Multidetector CT imaging of the abdomen and pelvis was performed following the standard protocol without IV contrast. COMPARISON:  CT Abdomen and Pelvis 06/12/2016. FINDINGS: Lower chest: Stable elevation of the left hemidiaphragm. Chronic lung base scarring and/or atelectasis has not significantly changed. No pleural or pericardial effusion. Calcified coronary artery and aortic atherosclerosis. Hepatobiliary: Stable since 2017; tiny low-density area in the ventral right hepatic lobe near the surface of the liver on series 2, image 20 is stable and appears benign. Evidence of small dependent  gallstones on series 2, image 29, but no pericholecystic inflammation. No bile duct enlargement. Pancreas: Negative. Spleen: Negative. Adrenals/Urinary Tract: Normal adrenal glands. Noncontrast kidneys with no nephrolithiasis or hydronephrosis. However, there is new asymmetric right perinephric stranding post apparent at the lower pole on coronal image 64. But the right ureter is also decompressed and without stranding. No urinary calculus. Normal left ureter also. Urinary bladder is nondilated. The right fundus of the bladder is mildly herniated into the mostly fat containing right inguinal hernia on sagittal image 59, although has not significantly changed since 2017. No other perivesical stranding. Prostatomegaly. Stomach/Bowel: Negative rectum. Redundant sigmoid colon with occasional diverticula. Decompressed and negative descending colon. Gas-filled transverse colon and gas and retained stool in the right colon which are nondilated. Normal appendix on series 2, image 77. Negative terminal ileum. No dilated small bowel. Decompressed stomach and duodenum. No free air, free fluid. Vascular/Lymphatic: Vascular patency is not evaluated in the absence of IV contrast. Aortoiliac calcified atherosclerosis. No lymphadenopathy. Reproductive: Chronic fat containing inguinal hernias are stable since 2017, that on the right affecting the bladder fundus is further described above. Prostatomegaly. Otherwise negative. Other: No pelvic free fluid. Musculoskeletal: Stable, with frequent mild endplate deformities in the lower thoracic and the lumbar spine. No acute osseous abnormality identified. IMPRESSION: 1. No urinary calculus  or obstructive uropathy. Nonspecific right perinephric stranding has increased since 2017 but is mild - such as can be seen with renal insufficiency and other intrinsic renal disease. There are chronic mostly fat containing inguinal hernias, although the right hernia also contains a small portion of the  bladder fundus (sagittal image 59) - but this has not significantly changed. 2. Cholelithiasis without CT evidence of acute cholecystitis. If there is right upper quadrant pain then ultrasound would be complementary. 3. No other acute or inflammatory process.  Normal appendix. 4.  Aortic Atherosclerosis (ICD10-I70.0). Electronically Signed   By: Odessa FlemingH  Hall M.D.   On: 09/12/2019 13:18    ROS:  Pertinent items are noted in HPI.  Blood pressure (!) 149/95, pulse 86, temperature 97.8 F (36.6 C), resp. rate 17, height 6' (1.829 m), weight 95.3 kg, SpO2 95 %. Physical Exam: Pleasant white male no acute distress Head is normocephalic, atraumatic Eyes without scleral icterus Lungs clear to auscultation with good breath sounds bilaterally Heart examination reveals regular rate and rhythm without S3, S4, murmurs Abdomen is soft, nontender, nondistended.  No hepatosplenomegaly, masses, or hernias are identified.  No rigidity is noted.  CT scan and ultrasound reports reviewed.  Labs reviewed.  Assessment/Plan: Impression: Biliary colic, cholelithiasis.  His leukocytosis has resolved.  I suspect this was secondary to acute phase reactivity.  His liver enzyme tests have normalized. Plan: Patient would like to be discharged home, which is fine with me.  He does not need emergent cholecystectomy at this time.  The signs and symptoms of biliary colic were given to the patient.  He was instructed to call me should he have another episode.  I would send him home on a 5-day course of ciprofloxacin as a precaution.  I have already advanced his diet.  This was discussed with Dr. Laural BenesJohnson.  Franky MachoMark Nabeel Gladson 09/13/2019, 10:29 AM

## 2019-09-13 NOTE — Discharge Instructions (Signed)
Cholelithiasis  Cholelithiasis is also called "gallstones." It is a kind of gallbladder disease. The gallbladder is an organ that stores a liquid (bile) that helps you digest fat. Gallstones may not cause symptoms (may be silent gallstones) until they cause a blockage, and then they can cause pain (gallbladder attack). Follow these instructions at home:  Take over-the-counter and prescription medicines only as told by your doctor.  Stay at a healthy weight.  Eat healthy foods. This includes: ? Eating fewer fatty foods, like fried foods. ? Eating fewer refined carbs (refined carbohydrates). Refined carbs are breads and grains that are highly processed, like white bread and white rice. Instead, choose whole grains like whole-wheat bread and brown rice. ? Eating more fiber. Almonds, fresh fruit, and beans are healthy sources of fiber.  Keep all follow-up visits as told by your doctor. This is important. Contact a doctor if:  You have sudden pain in the upper right side of your belly (abdomen). Pain might spread to your right shoulder or your chest. This may be a sign of a gallbladder attack.  You feel sick to your stomach (are nauseous).  You throw up (vomit).  You have been diagnosed with gallstones that have no symptoms and you get: ? Belly pain. ? Discomfort, burning, or fullness in the upper part of your belly (indigestion). Get help right away if:  You have sudden pain in the upper right side of your belly, and it lasts for more than 2 hours.  You have belly pain that lasts for more than 5 hours.  You have a fever or chills.  You keep feeling sick to your stomach or you keep throwing up.  Your skin or the whites of your eyes turn yellow (jaundice).  You have dark-colored pee (urine).  You have light-colored poop (stool). Summary  Cholelithiasis is also called "gallstones."  The gallbladder is an organ that stores a liquid (bile) that helps you digest fat.  Silent  gallstones are gallstones that do not cause symptoms.  A gallbladder attack may cause sudden pain in the upper right side of your belly. Pain might spread to your right shoulder or your chest. If this happens, contact your doctor.  If you have sudden pain in the upper right side of your belly that lasts for more than 2 hours, get help right away. This information is not intended to replace advice given to you by your health care provider. Make sure you discuss any questions you have with your health care provider. Document Released: 03/06/2008 Document Revised: 08/31/2017 Document Reviewed: 06/04/2016 Elsevier Patient Education  2020 [place].     IMPORTANT INFORMATION: PAY CLOSE ATTENTION   PHYSICIAN DISCHARGE INSTRUCTIONS  Follow with Primary care provider  Burdine, Virgina Evener, MD  and other consultants as instructed by your Hospitalist Physician  [place] IF SYMPTOMS COME BACK, WORSEN OR NEW PROBLEM DEVELOPS   Please note: You were cared for by a hospitalist during your hospital stay. Every effort will be made to forward records to your primary care provider.  You can request that your primary care provider send for your hospital records if they have not received them.  Once you are discharged, your primary care physician will handle any further medical issues. Please note that NO REFILLS for any discharge medications will be authorized once you are discharged, as it is imperative that you return to your primary care physician (or establish a relationship with a primary care physician  if you do not have one) for your post hospital discharge needs so that they can reassess your need for medications and monitor your lab values.  Please get a complete blood count and chemistry panel checked by your Primary MD at your next visit, and again as instructed by your Primary MD.  Get Medicines reviewed and adjusted: Please take all your medications with you  for your next visit with your Primary MD  Laboratory/radiological data: Please request your Primary MD to go over all hospital tests and procedure/radiological results at the follow up, please ask your primary care provider to get all Hospital records sent to his/her office.  In some cases, they will be blood work, cultures and biopsy results pending at the time of your discharge. Please request that your primary care provider follow up on these results.  If you are diabetic, please bring your blood sugar readings with you to your follow up appointment with primary care.    Please call and make your follow up appointments as soon as possible.    Also Note the following: If you experience worsening of your admission symptoms, develop shortness of breath, life threatening emergency, suicidal or homicidal thoughts you must seek medical attention immediately by calling 911 or calling your MD immediately  if symptoms less severe.  You must read complete instructions/literature along with all the possible adverse reactions/side effects for all the Medicines you take and that have been prescribed to you. Take any new Medicines after you have completely understood and accpet all the possible adverse reactions/side effects.   Do not drive when taking Pain medications or sleeping medications (Benzodiazepines)  Do not take more than prescribed Pain, Sleep and Anxiety Medications. It is not advisable to combine anxiety,sleep and pain medications without talking with your primary care practitioner  Special Instructions: If you have smoked or chewed Tobacco  in the last 2 yrs please stop smoking, stop any regular Alcohol  and or any Recreational drug use.  Wear Seat belts while driving.  Do not drive if taking any narcotic, mind altering or controlled substances or recreational drugs or alcohol.

## 2019-09-13 NOTE — Discharge Summary (Signed)
Physician Discharge Summary  Leonard Flores ZOX:[id number] DOB: 26-Oct-1940 DOA: 09/12/2019  PCP: Juliette Alcide, MD  Admit date: 09/12/2019 Discharge date: 09/13/2019  Admitted From:  Home  Disposition:  Home   Recommendations for Outpatient Follow-up:  1. Follow up with PCP in 1 weeks 2. Follow up with Dr. Lovell Sheehan for any problems with gallbladder  Discharge Condition: STABLE   CODE STATUS: FULL    Brief Hospitalization Summary: Please see all hospital notes, images, labs for full details of the hospitalization.  Brief Admission Hx: 78 y.o.malewith medical history significant fordiabetes mellitus, hypertension, dyslipidemia. Patient presented to the ED with complaints of right-sided abdominal pain of 3 days duration.  MDM/Assessment & Plan:   1. Biliary colic - Pt does have cholelithiasis but doesn't seem to be having acute cholecystitis at this time.  His abdomen remains soft.  He does have a lot of gas which could be causing some of his pain.  His WBC and LFts have improved.  He was seen by Dr. Lovell Sheehan and started on soft diet.  He can discharge home with outpatient follow up with Dr. Lovell Sheehan.  He will discharge on 5 days of oral ciprofloxacin.  2. Hyponatremia - secondary to dehydration, this corrected with IV fluids.   3. Leukocytosis - likely was reactive, WBC is trending down with supportive care.   4. Essential hypertension - blood pressures are well controlled. Following.  5. Type 2 diabetes mellitus - resume home therapy.   DVT prophylaxis: SCDs Code Status: Full  Family Communication: patient updated at bedside, verbalized understanding Disposition Plan: inpatient   Consultants:  Surgery  Discharge Diagnoses:  Principal Problem:   RUQ abdominal pain Active Problems:   Abdominal pain   Calculus of gallbladder with acute cholecystitis without obstruction   Diabetes mellitus without complication (HCC)   Leukocytosis   Hypertension   Discharge  Instructions: Discharge Instructions    Call MD for:  severe uncontrolled pain   Complete by: As directed    Diet - low sodium heart healthy   Complete by: As directed    Increase activity slowly   Complete by: As directed      Allergies as of 09/13/2019      Reactions   Penicillins Itching   Did it involve swelling of the face/tongue/throat, SOB, or low BP? No Did it involve sudden or severe rash/hives, skin peeling, or any reaction on the inside of your mouth or nose? Yes Did you need to seek medical attention at a hospital or doctor's office? No When did it last happen?Over 10 years ago If all above answers are "NO", may proceed with cephalosporin use.      Medication List    TAKE these medications   atorvastatin 20 MG tablet Commonly known as: LIPITOR Take 20 mg by mouth daily.   ciprofloxacin 500 MG tablet Commonly known as: Cipro Take 1 tablet (500 mg total) by mouth 2 (two) times daily.   ibuprofen 200 MG tablet Commonly known as: ADVIL Take 600 mg by mouth every 6 (six) hours as needed.   metFORMIN 500 MG tablet Commonly known as: GLUCOPHAGE Take 500 mg by mouth 2 (two) times daily.   metoprolol tartrate 50 MG tablet Commonly known as: LOPRESSOR Take 50 mg by mouth 2 (two) times daily.   omeprazole 40 MG capsule Commonly known as: PRILOSEC Take 40 mg by mouth daily.   tamsulosin 0.4 MG Caps capsule Commonly known as: FLOMAX Take 0.4 mg by mouth daily.  traMADol 50 MG tablet Commonly known as: ULTRAM Take 50 mg by mouth 2 (two) times daily as needed.      Follow-up Information    Burdine, Ananias PilgrimSteven E, MD. Schedule an appointment as soon as possible for a visit in 1 week(s).   Specialty: Family Medicine Why: Hospital Follow Up Contact information: [address] [place] [id number] [phone]        Franky MachoJenkins, Mark, MD Follow up.   Specialty: General Surgery Why: If you have any gallbladder problems.  Contact information: [address] [place] [id number] [phone]          Allergies  Allergen Reactions  . Penicillins Itching    Did it involve swelling of the face/tongue/throat, SOB, or low BP? No Did it involve sudden or severe rash/hives, skin peeling, or any reaction on the inside of your mouth or nose? Yes Did you need to seek medical attention at a hospital or doctor's office? No When did it last happen?Over 10 years ago If all above answers are "NO", may proceed with cephalosporin use.   Allergies as of 09/13/2019      Reactions   Penicillins Itching   Did it involve swelling of the face/tongue/throat, SOB, or low BP? No Did it involve sudden or severe rash/hives, skin peeling, or any reaction on the inside of your mouth or nose? Yes Did you need to seek medical attention at a hospital or doctor's office? No When did it last happen?Over 10 years ago If all above answers are "NO", may proceed with cephalosporin use.      Medication List    TAKE these medications   atorvastatin 20 MG tablet Commonly known as: LIPITOR Take 20 mg by mouth daily.   ciprofloxacin 500 MG tablet Commonly known as: Cipro Take 1 tablet (500 mg total) by mouth 2 (two) times daily.   ibuprofen 200 MG tablet Commonly known as: ADVIL Take 600 mg by mouth every 6 (six) hours as needed.   metFORMIN 500 MG tablet Commonly known as: GLUCOPHAGE Take 500 mg by mouth 2 (two) times daily.   metoprolol tartrate 50 MG tablet Commonly known as: LOPRESSOR Take 50 mg by mouth 2 (two) times daily.   omeprazole 40 MG capsule Commonly known as: PRILOSEC Take 40 mg by mouth daily.   tamsulosin 0.4 MG Caps capsule Commonly known as: FLOMAX Take 0.4 mg by mouth daily.   traMADol 50 MG tablet Commonly known as: ULTRAM Take 50 mg by mouth 2 (two) times daily as needed.       Procedures/Studies: US Abdomen Complete  Result Date: 09/12/2019 CLINICAL DATA:  Right-sided abdominal pain, possible cholecystitis  EXAM: ABDOMEN ULTRASOUND COMPLETE COMPARISON:  Same-day renal colic CT, CT abdomen pelvis 06/12/2016 FINDINGS: Gallbladder: Multiple echogenic, posteriorly shadowing gallstones are present in the gallbladder, largest measuring up to 1.2 cm in maximal dimension. Sonographic Eulah PontMurphy sign is reportedly negative. No gallbladder wall thickening or pericholecystic fluid. Common bile duct: Diameter: 4.2 mm, nondilated Liver: A tiny hepatic cyst seen on CT imaging is not well visualized on the sonographic evaluation of the liver. Diffusely increased hepatic echogenicity with loss of definition of the portal triads and diminished posterior through transmission compatible with hepatic steatosis. Portal vein is patent on color Doppler imaging with normal direction of blood flow towards the liver. IVC: No abnormality visualized. Pancreas: Poorly visualized due to bowel gas. Spleen: Poorly visualized due to bowel gas. Right Kidney: Length: 12.1 cm. Echogenicity within normal limits. No mass  or hydronephrosis visualized. Left Kidney: Length: 10.6 cm. Echogenicity within normal limits. No mass or hydronephrosis visualized. Abdominal aorta: Distal aorta is unremarkable with a diameter 1.9 cm. Proximal extent is not visualized due to bowel gas. Other findings: None. IMPRESSION: Cholelithiasis without sonographic evidence of acute cholecystitis. Difficult visualization of some abdominal viscera due to extensive bowel gas including the pancreas, spleen and proximal aorta. Mild hepatic steatosis. Tiny hepatic cyst seen on CT is not well visualized on this exam. Electronically Signed   By: Kreg Shropshire M.D.   On: 09/12/2019 14:48   CT Renal Stone Study  Result Date: 09/12/2019 CLINICAL DATA:  78 year old male with right flank pain radiating to the groin for 4 days. EXAM: CT ABDOMEN AND PELVIS WITHOUT CONTRAST TECHNIQUE: Multidetector CT imaging of the abdomen and pelvis was performed following the standard protocol without IV  contrast. COMPARISON:  CT Abdomen and Pelvis 06/12/2016. FINDINGS: Lower chest: Stable elevation of the left hemidiaphragm. Chronic lung base scarring and/or atelectasis has not significantly changed. No pleural or pericardial effusion. Calcified coronary artery and aortic atherosclerosis. Hepatobiliary: Stable since 2017; tiny low-density area in the ventral right hepatic lobe near the surface of the liver on series 2, image 20 is stable and appears benign. Evidence of small dependent gallstones on series 2, image 29, but no pericholecystic inflammation. No bile duct enlargement. Pancreas: Negative. Spleen: Negative. Adrenals/Urinary Tract: Normal adrenal glands. Noncontrast kidneys with no nephrolithiasis or hydronephrosis. However, there is new asymmetric right perinephric stranding post apparent at the lower pole on coronal image 64. But the right ureter is also decompressed and without stranding. No urinary calculus. Normal left ureter also. Urinary bladder is nondilated. The right fundus of the bladder is mildly herniated into the mostly fat containing right inguinal hernia on sagittal image 59, although has not significantly changed since 2017. No other perivesical stranding. Prostatomegaly. Stomach/Bowel: Negative rectum. Redundant sigmoid colon with occasional diverticula. Decompressed and negative descending colon. Gas-filled transverse colon and gas and retained stool in the right colon which are nondilated. Normal appendix on series 2, image 77. Negative terminal ileum. No dilated small bowel. Decompressed stomach and duodenum. No free air, free fluid. Vascular/Lymphatic: Vascular patency is not evaluated in the absence of IV contrast. Aortoiliac calcified atherosclerosis. No lymphadenopathy. Reproductive: Chronic fat containing inguinal hernias are stable since 2017, that on the right affecting the bladder fundus is further described above. Prostatomegaly. Otherwise negative. Other: No pelvic free  fluid. Musculoskeletal: Stable, with frequent mild endplate deformities in the lower thoracic and the lumbar spine. No acute osseous abnormality identified. IMPRESSION: 1. No urinary calculus or obstructive uropathy. Nonspecific right perinephric stranding has increased since 2017 but is mild - such as can be seen with renal insufficiency and other intrinsic renal disease. There are chronic mostly fat containing inguinal hernias, although the right hernia also contains a small portion of the bladder fundus (sagittal image 59) - but this has not significantly changed. 2. Cholelithiasis without CT evidence of acute cholecystitis. If there is right upper quadrant pain then ultrasound would be complementary. 3. No other acute or inflammatory process.  Normal appendix. 4.  Aortic Atherosclerosis (ICD10-I70.0). Electronically Signed   By: Odessa Fleming M.D.   On: 09/12/2019 13:18      Subjective: Pt feeling much better.  Wants to go home.   Discharge Exam: Vitals:   09/13/19 0455 09/13/19 0955  BP: (!) 146/76 (!) 149/95  Pulse: 72 86  Resp: 17   Temp: 97.8 F (36.6  C)   SpO2: 95%    Vitals:   09/12/19 2017 09/12/19 2017 09/13/19 0455 09/13/19 0955  BP:  139/79 (!) 146/76 (!) 149/95  Pulse:  78 72 86  Resp:  18 17   Temp:  97.6 F (36.4 C) 97.8 F (36.6 C)   TempSrc:  Oral    SpO2: 95% 95% 95%   Weight:      Height:        General: Pt is alert, awake, not in acute distress Cardiovascular: RRR, S1/S2 +, no rubs, no gallops Respiratory: CTA bilaterally, no wheezing, no rhonchi Abdominal: Soft, NT, ND, bowel sounds + Extremities: no edema, no cyanosis   The results of significant diagnostics from this hospitalization (including imaging, microbiology, ancillary and laboratory) are listed below for reference.     Microbiology: Recent Results (from the past 240 hour(s))  Respiratory Panel by RT PCR (Flu A&B, Covid) - Nasopharyngeal Swab     Status: None   Collection Time: 09/12/19  3:05 PM    Specimen: Nasopharyngeal Swab  Result Value Ref Range Status   SARS Coronavirus 2 by RT PCR NEGATIVE NEGATIVE Final    Comment: (NOTE) SARS-CoV-2 target nucleic acids are NOT DETECTED. The SARS-CoV-2 RNA is generally detectable in upper respiratoy specimens during the acute phase of infection. The lowest concentration of SARS-CoV-2 viral copies this assay can detect is 131 copies/mL. A negative result does not preclude SARS-Cov-2 infection and should not be used as the sole basis for treatment or other patient management decisions. A negative result may occur with  improper specimen collection/handling, submission of specimen other than nasopharyngeal swab, presence of viral mutation(s) within the areas targeted by this assay, and inadequate number of viral copies (<131 copies/mL). A negative result must be combined with clinical observations, patient history, and epidemiological information. The expected result is Negative. Fact Sheet for Patients:  PinkCheek.be Fact Sheet for Healthcare Providers:  GravelBags.it This test is not yet ap proved or cleared by the [place] FDA and  has been authorized for detection and/or diagnosis of SARS-CoV-2 by FDA under an Emergency Use Authorization (EUA). This EUA will remain  in effect (meaning this test can be used) for the duration of the COVID-19 declaration under Section 564(b)(1) of the Act, 21 U.S.C. section 360bbb-3(b)(1), unless the authorization is terminated or revoked sooner.    Influenza A by PCR NEGATIVE NEGATIVE Final   Influenza B by PCR NEGATIVE NEGATIVE Final    Comment: (NOTE) The Xpert Xpress SARS-CoV-2/FLU/RSV assay is intended as an aid in  the diagnosis of influenza from Nasopharyngeal swab specimens and  should not be used as a sole basis for treatment. Nasal washings and  aspirates are unacceptable for Xpert Xpress SARS-CoV-2/FLU/RSV  testing. Fact Sheet  for Patients: PinkCheek.be Fact Sheet for Healthcare Providers: GravelBags.it This test is not yet approved or cleared by the [place] FDA and  has been authorized for detection and/or diagnosis of SARS-CoV-2 by  FDA under an Emergency Use Authorization (EUA). This EUA will remain  in effect (meaning this test can be used) for the duration of the  Covid-19 declaration under Section 564(b)(1) of the Act, 21  U.S.C. section 360bbb-3(b)(1), unless the authorization is  terminated or revoked. Performed at [facility], [address] [place], [place] [address]      Labs: BNP (last 3 results) No results for input(s): BNP in the last 8760 hours. Basic Metabolic Panel: Recent Labs  Lab 09/12/19 1121 09/13/19 0540  NA 132*  136  K 5.4* 4.1  CL 95* 100  CO2 27 25  GLUCOSE 102* 113*  BUN 24* 22  CREATININE 1.27* 1.10  CALCIUM 8.9 8.7*   Liver Function Tests: Recent Labs  Lab 09/12/19 1121 09/13/19 0540  AST 43* 22  ALT 50* 37  ALKPHOS 71 57  BILITOT 1.6* 0.9  PROT 7.3 6.2*  ALBUMIN 3.7 3.1*   No results for input(s): LIPASE, AMYLASE in the last 168 hours. No results for input(s): AMMONIA in the last 168 hours. CBC: Recent Labs  Lab 09/12/19 1121 09/13/19 0540  WBC 17.2* 11.5*  NEUTROABS 12.9*  --   HGB 15.5 13.6  HCT 48.3 41.9  MCV 89.6 88.0  PLT 189 174   Cardiac Enzymes: No results for input(s): CKTOTAL, CKMB, CKMBINDEX, TROPONINI in the last 168 hours. BNP: Invalid input(s): POCBNP CBG: Recent Labs  Lab 09/12/19 2032 09/13/19 0004 09/13/19 0453 09/13/19 0734  GLUCAP 79 129* 123* 100*   D-Dimer No results for input(s): DDIMER in the last 72 hours. Hgb A1c No results for input(s): HGBA1C in the last 72 hours. Lipid Profile No results for input(s): CHOL, HDL, LDLCALC, TRIG, CHOLHDL, LDLDIRECT in the last 72 hours. Thyroid function studies No results for input(s): TSH, T4TOTAL, T3FREE,  THYROIDAB in the last 72 hours.  Invalid input(s): FREET3 Anemia work up No results for input(s): VITAMINB12, FOLATE, FERRITIN, TIBC, IRON, RETICCTPCT in the last 72 hours. Urinalysis    Component Value Date/Time   COLORURINE STRAW (A) 09/12/2019 1622   APPEARANCEUR CLEAR 09/12/2019 1622   LABSPEC 1.010 09/12/2019 1622   PHURINE 6.0 09/12/2019 1622   GLUCOSEU NEGATIVE 09/12/2019 1622   HGBUR MODERATE (A) 09/12/2019 1622   BILIRUBINUR NEGATIVE 09/12/2019 1622   KETONESUR 5 (A) 09/12/2019 1622   PROTEINUR NEGATIVE 09/12/2019 1622   UROBILINOGEN 0.2 10/26/2013 2223   NITRITE NEGATIVE 09/12/2019 1622   LEUKOCYTESUR NEGATIVE 09/12/2019 1622   Sepsis Labs Invalid input(s): PROCALCITONIN,  WBC,  LACTICIDVEN Microbiology Recent Results (from the past 240 hour(s))  Respiratory Panel by RT PCR (Flu A&B, Covid) - Nasopharyngeal Swab     Status: None   Collection Time: 09/12/19  3:05 PM   Specimen: Nasopharyngeal Swab  Result Value Ref Range Status   SARS Coronavirus 2 by RT PCR NEGATIVE NEGATIVE Final    Comment: (NOTE) SARS-CoV-2 target nucleic acids are NOT DETECTED. The SARS-CoV-2 RNA is generally detectable in upper respiratoy specimens during the acute phase of infection. The lowest concentration of SARS-CoV-2 viral copies this assay can detect is 131 copies/mL. A negative result does not preclude SARS-Cov-2 infection and should not be used as the sole basis for treatment or other patient management decisions. A negative result may occur with  improper specimen collection/handling, submission of specimen other than nasopharyngeal swab, presence of viral mutation(s) within the areas targeted by this assay, and inadequate number of viral copies (<131 copies/mL). A negative result must be combined with clinical observations, patient history, and epidemiological information. The expected result is Negative. Fact Sheet for Patients:  https://www.moore.com/ Fact  Sheet for Healthcare Providers:  https://www.young.biz/ This test is not yet ap proved or cleared by the [place] FDA and  has been authorized for detection and/or diagnosis of SARS-CoV-2 by FDA under an Emergency Use Authorization (EUA). This EUA will remain  in effect (meaning this test can be used) for the duration of the COVID-19 declaration under Section 564(b)(1) of the Act, 21 U.S.C. section 360bbb-3(b)(1), unless the authorization is terminated or revoked sooner.  Influenza A by PCR NEGATIVE NEGATIVE Final   Influenza B by PCR NEGATIVE NEGATIVE Final    Comment: (NOTE) The Xpert Xpress SARS-CoV-2/FLU/RSV assay is intended as an aid in  the diagnosis of influenza from Nasopharyngeal swab specimens and  should not be used as a sole basis for treatment. Nasal washings and  aspirates are unacceptable for Xpert Xpress SARS-CoV-2/FLU/RSV  testing. Fact Sheet for Patients: https://www.moore.com/ Fact Sheet for Healthcare Providers: https://www.young.biz/ This test is not yet approved or cleared by the [place] FDA and  has been authorized for detection and/or diagnosis of SARS-CoV-2 by  FDA under an Emergency Use Authorization (EUA). This EUA will remain  in effect (meaning this test can be used) for the duration of the  Covid-19 declaration under Section 564(b)(1) of the Act, 21  U.S.C. section 360bbb-3(b)(1), unless the authorization is  terminated or revoked. Performed at [facility], [address] [place], [place] [address]     Time coordinating discharge:   SIGNED:  Standley Dakins, MD  [facility] 09/13/2019, 10:31 AM How to contact the [facility] Attending or Consulting provider 7A - 7P or covering provider during after hours 7P -7A, for this patient?  1. Check the care team in [facility] and look for a) attending/consulting [facility] provider listed and b) the [facility] team listed 2. Log into [email or web address] and  use [place]'s universal password to access. If you do not have the password, please contact the hospital operator. 3. Locate the [facility] provider you are looking for under [facility] and page to a number that you can be directly reached. 4. If you still have difficulty reaching the provider, please page the [facility] (Director on Call) for the Hospitalists listed on amion for assistance.

## 2019-09-13 NOTE — Progress Notes (Signed)
Nsg Discharge Note  Admit Date:  09/12/2019 Discharge date: 09/13/2019   Christell Constant to be D/C'd Home per MD order.  AVS completed.  Patient able to verbalize understanding.  Discharge Medication: Allergies as of 09/13/2019      Reactions   Penicillins Itching   Did it involve swelling of the face/tongue/throat, SOB, or low BP? No Did it involve sudden or severe rash/hives, skin peeling, or any reaction on the inside of your mouth or nose? Yes Did you need to seek medical attention at a hospital or doctor's office? No When did it last happen?Over 10 years ago If all above answers are "NO", may proceed with cephalosporin use.      Medication List    TAKE these medications   atorvastatin 20 MG tablet Commonly known as: LIPITOR Take 20 mg by mouth daily.   ciprofloxacin 500 MG tablet Commonly known as: Cipro Take 1 tablet (500 mg total) by mouth 2 (two) times daily.   ibuprofen 200 MG tablet Commonly known as: ADVIL Take 600 mg by mouth every 6 (six) hours as needed.   metFORMIN 500 MG tablet Commonly known as: GLUCOPHAGE Take 500 mg by mouth 2 (two) times daily.   metoprolol tartrate 50 MG tablet Commonly known as: LOPRESSOR Take 50 mg by mouth 2 (two) times daily.   omeprazole 40 MG capsule Commonly known as: PRILOSEC Take 40 mg by mouth daily.   tamsulosin 0.4 MG Caps capsule Commonly known as: FLOMAX Take 0.4 mg by mouth daily.   traMADol 50 MG tablet Commonly known as: ULTRAM Take 50 mg by mouth 2 (two) times daily as needed.       Discharge Assessment: Vitals:   09/13/19 0455 09/13/19 0955  BP: (!) 146/76 (!) 149/95  Pulse: 72 86  Resp: 17   Temp: 97.8 F (36.6 C)   SpO2: 95%    Skin clean, dry and intact without evidence of skin break down, no evidence of skin tears noted. IV catheter discontinued intact. Site without signs and symptoms of complications - no redness or edema noted at insertion site, patient denies c/o pain - only slight  tenderness at site.  Dressing with slight pressure applied.  D/c Instructions-Education: Discharge instructions given to patient with verbalized understanding. D/c education completed with patient including follow up instructions, medication list, d/c activities limitations if indicated, with other d/c instructions as indicated by MD - patient able to verbalize understanding, all questions fully answered. Patient instructed to return to ED, call 911, or call MD for any changes in condition.  Patient escorted via [place], and D/C home via private auto.  Skylarr Liz C, RN 09/13/2019 11:04 AM

## 2019-09-13 NOTE — Progress Notes (Addendum)
PROGRESS NOTE [facility]   COLON RUETH  VWU:[id number]  DOB: [birth year]  DOA: 09/12/2019 PCP: Juliette Alcide, MD   Brief Admission Hx: 78 y.o. male with medical history significant for diabetes mellitus, hypertension, dyslipidemia.  Patient presented to the ED with complaints of right-sided abdominal pain of 3 days duration.  MDM/Assessment & Plan:   1. Nonspecific right sided abdominal pain - Pt does have cholelithiasis but doesn't seem to be having acute cholecystitis at this time.  His abdomen remains soft.  He does have a lot of gas which could be causing some of his pain.  He is NPO pending a surgical consult.  Further recommendations to follow.   2. Hyponatremia - secondary to dehydration, this corrected with IV fluids.   3. Leukocytosis - likely was reactive, WBC is trending down with supportive care.   4. Essential hypertension - blood pressures are well controlled. Following.  5. Type 2 diabetes mellitus - He was having low blood sugars so I have discontinued all insulin, continue close monitoring of CBGs.  He will likely start eating after surgery consult.  DVT prophylaxis: SCDs Code Status: Full  Family Communication: patient updated at bedside, verbalized understanding Disposition Plan: inpatient   Consultants:  Surgery   Procedures:    Antimicrobials:     Subjective: Pt says that he still has right flank pain   Objective: Vitals:   09/12/19 1851 09/12/19 2017 09/12/19 2017 09/13/19 0455  BP: (!) 164/100  139/79 (!) 146/76  Pulse: 85  78 72  Resp: Temp:   97.6 F (36.4 C) 97.8 F (36.6 C)  TempSrc:   Oral   SpO2: 97% 95% 95% 95%  Weight:      Height:        Intake/Output Summary (Last 24 hours) at 09/13/2019 0934 Last data filed at 09/13/2019 0300 Gross per 24 hour  Intake 1443.28 ml  Output --  Net 1443.28 ml   Filed Weights   09/12/19 1052  Weight: 95.3 kg     REVIEW OF SYSTEMS  As per history otherwise  all reviewed and reported negative  Exam:  General exam: awake, alert, NAD, cooperative.   Respiratory system: Clear. No increased work of breathing. Cardiovascular system: S1 & S2 heard. No JVD, murmurs, gallops, clicks or pedal edema. Gastrointestinal system: Abdomen is nondistended, soft and mild RLQ tenderness but negative murphy sign, no guarding and no rebound tenderness. Normal bowel sounds heard. Central nervous system: Alert and oriented. No focal neurological deficits. Extremities: no CCE.  Data Reviewed: Basic Metabolic Panel: Recent Labs  Lab 09/12/19 1121 09/13/19 0540  NA 132* 136  K 5.4* 4.1  CL 95* 100  CO2 27 25  GLUCOSE 102* 113*  BUN 24* 22  CREATININE 1.27* 1.10  CALCIUM 8.9 8.7*   Liver Function Tests: Recent Labs  Lab 09/12/19 1121 09/13/19 0540  AST 43* 22  ALT 50* 37  ALKPHOS 71 57  BILITOT 1.6* 0.9  PROT 7.3 6.2*  ALBUMIN 3.7 3.1*   No results for input(s): LIPASE, AMYLASE in the last 168 hours. No results for input(s): AMMONIA in the last 168 hours. CBC: Recent Labs  Lab 09/12/19 1121 09/13/19 0540  WBC 17.2* 11.5*  NEUTROABS 12.9*  --   HGB 15.5 13.6  HCT 48.3 41.9  MCV 89.6 88.0  PLT 189 174   Cardiac Enzymes: No results for input(s): CKTOTAL, CKMB, CKMBINDEX, TROPONINI in the last 168 hours. CBG (last 3)  Recent Labs    09/13/19 0004 09/13/19 0453 09/13/19 0734  GLUCAP 129* 123* 100*   Recent Results (from the past 240 hour(s))  Respiratory Panel by RT PCR (Flu A&B, Covid) - Nasopharyngeal Swab     Status: None   Collection Time: 09/12/19  3:05 PM   Specimen: Nasopharyngeal Swab  Result Value Ref Range Status   SARS Coronavirus 2 by RT PCR NEGATIVE NEGATIVE Final    Comment: (NOTE) SARS-CoV-2 target nucleic acids are NOT DETECTED. The SARS-CoV-2 RNA is generally detectable in upper respiratoy specimens during the acute phase of infection. The lowest concentration of SARS-CoV-2 viral copies this assay can detect  is 131 copies/mL. A negative result does not preclude SARS-Cov-2 infection and should not be used as the sole basis for treatment or other patient management decisions. A negative result may occur with  improper specimen collection/handling, submission of specimen other than nasopharyngeal swab, presence of viral mutation(s) within the areas targeted by this assay, and inadequate number of viral copies (<131 copies/mL). A negative result must be combined with clinical observations, patient history, and epidemiological information. The expected result is Negative. Fact Sheet for Patients:  https://www.moore.com/ Fact Sheet for Healthcare Providers:  https://www.young.biz/ This test is not yet ap proved or cleared by the [place] FDA and  has been authorized for detection and/or diagnosis of SARS-CoV-2 by FDA under an Emergency Use Authorization (EUA). This EUA will remain  in effect (meaning this test can be used) for the duration of the COVID-19 declaration under Section 564(b)(1) of the Act, 21 U.S.C. section 360bbb-3(b)(1), unless the authorization is terminated or revoked sooner.    Influenza A by PCR NEGATIVE NEGATIVE Final   Influenza B by PCR NEGATIVE NEGATIVE Final    Comment: (NOTE) The Xpert Xpress SARS-CoV-2/FLU/RSV assay is intended as an aid in  the diagnosis of influenza from Nasopharyngeal swab specimens and  should not be used as a sole basis for treatment. Nasal washings and  aspirates are unacceptable for Xpert Xpress SARS-CoV-2/FLU/RSV  testing. Fact Sheet for Patients: https://www.moore.com/ Fact Sheet for Healthcare Providers: https://www.young.biz/ This test is not yet approved or cleared by the [place] FDA and  has been authorized for detection and/or diagnosis of SARS-CoV-2 by  FDA under an Emergency Use Authorization (EUA). This EUA will remain  in effect (meaning this  test can be used) for the duration of the  Covid-19 declaration under Section 564(b)(1) of the Act, 21  U.S.C. section 360bbb-3(b)(1), unless the authorization is  terminated or revoked. Performed at [facility], [address] [place], [place] [address]      Studies: US Abdomen Complete  Result Date: 09/12/2019 CLINICAL DATA:  Right-sided abdominal pain, possible cholecystitis EXAM: ABDOMEN ULTRASOUND COMPLETE COMPARISON:  Same-day renal colic CT, CT abdomen pelvis 06/12/2016 FINDINGS: Gallbladder: Multiple echogenic, posteriorly shadowing gallstones are present in the gallbladder, largest measuring up to 1.2 cm in maximal dimension. Sonographic Eulah Pont sign is reportedly negative. No gallbladder wall thickening or pericholecystic fluid. Common bile duct: Diameter: 4.2 mm, nondilated Liver: A tiny hepatic cyst seen on CT imaging is not well visualized on the sonographic evaluation of the liver. Diffusely increased hepatic echogenicity with loss of definition of the portal triads and diminished posterior through transmission compatible with hepatic steatosis. Portal vein is patent on color Doppler imaging with normal direction of blood flow towards the liver. IVC: No abnormality visualized. Pancreas: Poorly visualized due to bowel gas. Spleen: Poorly visualized due to bowel gas. Right Kidney: Length: 12.1  cm. Echogenicity within normal limits. No mass or hydronephrosis visualized. Left Kidney: Length: 10.6 cm. Echogenicity within normal limits. No mass or hydronephrosis visualized. Abdominal aorta: Distal aorta is unremarkable with a diameter 1.9 cm. Proximal extent is not visualized due to bowel gas. Other findings: None. IMPRESSION: Cholelithiasis without sonographic evidence of acute cholecystitis. Difficult visualization of some abdominal viscera due to extensive bowel gas including the pancreas, spleen and proximal aorta. Mild hepatic steatosis. Tiny hepatic cyst seen on CT is not well visualized on  this exam. Electronically Signed   By: Kreg ShropshirePrice  DeHay M.D.   On: 09/12/2019 14:48   CT Renal Stone Study  Result Date: 09/12/2019 CLINICAL DATA:  78 year old male with right flank pain radiating to the groin for 4 days. EXAM: CT ABDOMEN AND PELVIS WITHOUT CONTRAST TECHNIQUE: Multidetector CT imaging of the abdomen and pelvis was performed following the standard protocol without IV contrast. COMPARISON:  CT Abdomen and Pelvis 06/12/2016. FINDINGS: Lower chest: Stable elevation of the left hemidiaphragm. Chronic lung base scarring and/or atelectasis has not significantly changed. No pleural or pericardial effusion. Calcified coronary artery and aortic atherosclerosis. Hepatobiliary: Stable since 2017; tiny low-density area in the ventral right hepatic lobe near the surface of the liver on series 2, image 20 is stable and appears benign. Evidence of small dependent gallstones on series 2, image 29, but no pericholecystic inflammation. No bile duct enlargement. Pancreas: Negative. Spleen: Negative. Adrenals/Urinary Tract: Normal adrenal glands. Noncontrast kidneys with no nephrolithiasis or hydronephrosis. However, there is new asymmetric right perinephric stranding post apparent at the lower pole on coronal image 64. But the right ureter is also decompressed and without stranding. No urinary calculus. Normal left ureter also. Urinary bladder is nondilated. The right fundus of the bladder is mildly herniated into the mostly fat containing right inguinal hernia on sagittal image 59, although has not significantly changed since 2017. No other perivesical stranding. Prostatomegaly. Stomach/Bowel: Negative rectum. Redundant sigmoid colon with occasional diverticula. Decompressed and negative descending colon. Gas-filled transverse colon and gas and retained stool in the right colon which are nondilated. Normal appendix on series 2, image 77. Negative terminal ileum. No dilated small bowel. Decompressed stomach and  duodenum. No free air, free fluid. Vascular/Lymphatic: Vascular patency is not evaluated in the absence of IV contrast. Aortoiliac calcified atherosclerosis. No lymphadenopathy. Reproductive: Chronic fat containing inguinal hernias are stable since 2017, that on the right affecting the bladder fundus is further described above. Prostatomegaly. Otherwise negative. Other: No pelvic free fluid. Musculoskeletal: Stable, with frequent mild endplate deformities in the lower thoracic and the lumbar spine. No acute osseous abnormality identified. IMPRESSION: 1. No urinary calculus or obstructive uropathy. Nonspecific right perinephric stranding has increased since 2017 but is mild - such as can be seen with renal insufficiency and other intrinsic renal disease. There are chronic mostly fat containing inguinal hernias, although the right hernia also contains a small portion of the bladder fundus (sagittal image 59) - but this has not significantly changed. 2. Cholelithiasis without CT evidence of acute cholecystitis. If there is right upper quadrant pain then ultrasound would be complementary. 3. No other acute or inflammatory process.  Normal appendix. 4.  Aortic Atherosclerosis (ICD10-I70.0). Electronically Signed   By: Odessa FlemingH  Hall M.D.   On: 09/12/2019 13:18     Scheduled Meds: . atorvastatin  20 mg Oral Daily  . metoprolol tartrate  50 mg Oral BID  . pantoprazole  40 mg Oral Daily  . tamsulosin  0.4 mg Oral Daily  Continuous Infusions: . sodium chloride 100 mL/hr at 09/13/19 0300    Principal Problem:   RUQ abdominal pain Active Problems:   Abdominal pain   Calculus of gallbladder with acute cholecystitis without obstruction   Diabetes mellitus without complication ([place])   Leukocytosis   Hypertension   Time spent:   Irwin Brakeman, MD [facility] 09/13/2019, 9:34 AM    LOS: 0 days  How to contact the [facility] Attending or Consulting provider [place] or covering provider during after hours  Blessing, for this patient?  1. Check the care team in [facility] and look for a) attending/consulting [facility] provider listed and b) the [facility] team listed 2. Log into [email or web address] and use [facility]'s universal password to access. If you do not have the password, please contact the hospital operator. 3. Locate the [facility] provider you are looking for under [facility] and page to a number that you can be directly reached. 4. If you still have difficulty reaching the provider, please page the [facility] (Director on Call) for the Hospitalists listed on amion for assistance.

## 2019-10-02 DIAGNOSIS — I10 Essential (primary) hypertension: Secondary | ICD-10-CM | POA: Diagnosis not present

## 2019-10-02 DIAGNOSIS — N401 Enlarged prostate with lower urinary tract symptoms: Secondary | ICD-10-CM | POA: Diagnosis not present

## 2019-10-13 DIAGNOSIS — M7542 Impingement syndrome of left shoulder: Secondary | ICD-10-CM | POA: Diagnosis not present

## 2019-10-13 DIAGNOSIS — K8020 Calculus of gallbladder without cholecystitis without obstruction: Secondary | ICD-10-CM | POA: Diagnosis not present

## 2019-10-13 DIAGNOSIS — N401 Enlarged prostate with lower urinary tract symptoms: Secondary | ICD-10-CM | POA: Diagnosis not present

## 2019-10-13 DIAGNOSIS — K8050 Calculus of bile duct without cholangitis or cholecystitis without obstruction: Secondary | ICD-10-CM | POA: Diagnosis not present

## 2019-10-13 DIAGNOSIS — E871 Hypo-osmolality and hyponatremia: Secondary | ICD-10-CM | POA: Diagnosis not present

## 2019-10-13 DIAGNOSIS — M7541 Impingement syndrome of right shoulder: Secondary | ICD-10-CM | POA: Diagnosis not present

## 2019-10-14 DIAGNOSIS — U071 COVID-19: Secondary | ICD-10-CM | POA: Diagnosis not present

## 2019-12-01 DIAGNOSIS — H2513 Age-related nuclear cataract, bilateral: Secondary | ICD-10-CM | POA: Diagnosis not present

## 2019-12-22 DIAGNOSIS — M7542 Impingement syndrome of left shoulder: Secondary | ICD-10-CM | POA: Diagnosis not present

## 2019-12-22 DIAGNOSIS — K219 Gastro-esophageal reflux disease without esophagitis: Secondary | ICD-10-CM | POA: Diagnosis not present

## 2019-12-22 DIAGNOSIS — R7301 Impaired fasting glucose: Secondary | ICD-10-CM | POA: Diagnosis not present

## 2019-12-22 DIAGNOSIS — I700 Atherosclerosis of aorta: Secondary | ICD-10-CM | POA: Diagnosis not present

## 2019-12-22 DIAGNOSIS — E782 Mixed hyperlipidemia: Secondary | ICD-10-CM | POA: Diagnosis not present

## 2019-12-22 DIAGNOSIS — E871 Hypo-osmolality and hyponatremia: Secondary | ICD-10-CM | POA: Diagnosis not present

## 2019-12-22 DIAGNOSIS — M7541 Impingement syndrome of right shoulder: Secondary | ICD-10-CM | POA: Diagnosis not present

## 2019-12-22 DIAGNOSIS — N401 Enlarged prostate with lower urinary tract symptoms: Secondary | ICD-10-CM | POA: Diagnosis not present

## 2019-12-22 DIAGNOSIS — I10 Essential (primary) hypertension: Secondary | ICD-10-CM | POA: Diagnosis not present

## 2019-12-22 DIAGNOSIS — Z6829 Body mass index (BMI) 29.0-29.9, adult: Secondary | ICD-10-CM | POA: Diagnosis not present

## 2019-12-24 DIAGNOSIS — H2511 Age-related nuclear cataract, right eye: Secondary | ICD-10-CM | POA: Diagnosis not present

## 2019-12-24 DIAGNOSIS — H2512 Age-related nuclear cataract, left eye: Secondary | ICD-10-CM | POA: Diagnosis not present

## 2019-12-25 ENCOUNTER — Ambulatory Visit: Payer: Medicare PPO

## 2020-01-20 DIAGNOSIS — Z6828 Body mass index (BMI) 28.0-28.9, adult: Secondary | ICD-10-CM | POA: Diagnosis not present

## 2020-01-20 DIAGNOSIS — R55 Syncope and collapse: Secondary | ICD-10-CM | POA: Diagnosis not present

## 2020-01-20 DIAGNOSIS — I10 Essential (primary) hypertension: Secondary | ICD-10-CM | POA: Diagnosis not present

## 2020-01-20 DIAGNOSIS — Z209 Contact with and (suspected) exposure to unspecified communicable disease: Secondary | ICD-10-CM | POA: Diagnosis not present

## 2020-01-21 DIAGNOSIS — K219 Gastro-esophageal reflux disease without esophagitis: Secondary | ICD-10-CM | POA: Diagnosis not present

## 2020-01-21 DIAGNOSIS — I10 Essential (primary) hypertension: Secondary | ICD-10-CM | POA: Diagnosis not present

## 2020-01-21 DIAGNOSIS — E785 Hyperlipidemia, unspecified: Secondary | ICD-10-CM | POA: Diagnosis not present

## 2020-01-21 DIAGNOSIS — R0789 Other chest pain: Secondary | ICD-10-CM | POA: Diagnosis not present

## 2020-01-21 DIAGNOSIS — Z79899 Other long term (current) drug therapy: Secondary | ICD-10-CM | POA: Diagnosis not present

## 2020-01-21 DIAGNOSIS — Z7984 Long term (current) use of oral hypoglycemic drugs: Secondary | ICD-10-CM | POA: Diagnosis not present

## 2020-01-23 DIAGNOSIS — Z6829 Body mass index (BMI) 29.0-29.9, adult: Secondary | ICD-10-CM | POA: Diagnosis not present

## 2020-01-23 DIAGNOSIS — I10 Essential (primary) hypertension: Secondary | ICD-10-CM | POA: Diagnosis not present

## 2020-01-23 DIAGNOSIS — R55 Syncope and collapse: Secondary | ICD-10-CM | POA: Diagnosis not present

## 2020-01-29 DIAGNOSIS — H2511 Age-related nuclear cataract, right eye: Secondary | ICD-10-CM | POA: Diagnosis not present

## 2020-02-03 DIAGNOSIS — R55 Syncope and collapse: Secondary | ICD-10-CM | POA: Diagnosis not present

## 2020-02-03 DIAGNOSIS — R2681 Unsteadiness on feet: Secondary | ICD-10-CM | POA: Diagnosis not present

## 2020-02-03 DIAGNOSIS — R42 Dizziness and giddiness: Secondary | ICD-10-CM | POA: Diagnosis not present

## 2020-02-03 DIAGNOSIS — I10 Essential (primary) hypertension: Secondary | ICD-10-CM | POA: Diagnosis not present

## 2020-02-04 DIAGNOSIS — Z6829 Body mass index (BMI) 29.0-29.9, adult: Secondary | ICD-10-CM | POA: Diagnosis not present

## 2020-02-04 DIAGNOSIS — R7301 Impaired fasting glucose: Secondary | ICD-10-CM | POA: Diagnosis not present

## 2020-02-04 DIAGNOSIS — E782 Mixed hyperlipidemia: Secondary | ICD-10-CM | POA: Diagnosis not present

## 2020-02-04 DIAGNOSIS — I10 Essential (primary) hypertension: Secondary | ICD-10-CM | POA: Diagnosis not present

## 2020-02-04 DIAGNOSIS — R55 Syncope and collapse: Secondary | ICD-10-CM | POA: Diagnosis not present

## 2020-02-09 DIAGNOSIS — H2512 Age-related nuclear cataract, left eye: Secondary | ICD-10-CM | POA: Diagnosis not present

## 2020-02-09 DIAGNOSIS — H25012 Cortical age-related cataract, left eye: Secondary | ICD-10-CM | POA: Diagnosis not present

## 2020-04-26 DIAGNOSIS — H9190 Unspecified hearing loss, unspecified ear: Secondary | ICD-10-CM | POA: Diagnosis not present

## 2020-04-26 DIAGNOSIS — Z6830 Body mass index (BMI) 30.0-30.9, adult: Secondary | ICD-10-CM | POA: Diagnosis not present

## 2020-04-26 DIAGNOSIS — H6123 Impacted cerumen, bilateral: Secondary | ICD-10-CM | POA: Diagnosis not present

## 2020-05-03 DIAGNOSIS — Z23 Encounter for immunization: Secondary | ICD-10-CM | POA: Diagnosis not present

## 2020-05-14 DIAGNOSIS — E782 Mixed hyperlipidemia: Secondary | ICD-10-CM | POA: Diagnosis not present

## 2020-05-14 DIAGNOSIS — R7301 Impaired fasting glucose: Secondary | ICD-10-CM | POA: Diagnosis not present

## 2020-05-14 DIAGNOSIS — Z6829 Body mass index (BMI) 29.0-29.9, adult: Secondary | ICD-10-CM | POA: Diagnosis not present

## 2020-05-14 DIAGNOSIS — I700 Atherosclerosis of aorta: Secondary | ICD-10-CM | POA: Diagnosis not present

## 2020-05-14 DIAGNOSIS — Z0001 Encounter for general adult medical examination with abnormal findings: Secondary | ICD-10-CM | POA: Diagnosis not present

## 2020-05-14 DIAGNOSIS — I10 Essential (primary) hypertension: Secondary | ICD-10-CM | POA: Diagnosis not present

## 2020-05-14 DIAGNOSIS — Z7189 Other specified counseling: Secondary | ICD-10-CM | POA: Diagnosis not present

## 2020-05-14 DIAGNOSIS — E559 Vitamin D deficiency, unspecified: Secondary | ICD-10-CM | POA: Diagnosis not present

## 2020-05-31 DIAGNOSIS — Z23 Encounter for immunization: Secondary | ICD-10-CM | POA: Diagnosis not present

## 2020-06-09 DIAGNOSIS — H04123 Dry eye syndrome of bilateral lacrimal glands: Secondary | ICD-10-CM | POA: Diagnosis not present

## 2020-07-15 DIAGNOSIS — H04123 Dry eye syndrome of bilateral lacrimal glands: Secondary | ICD-10-CM | POA: Diagnosis not present

## 2020-07-15 DIAGNOSIS — Z961 Presence of intraocular lens: Secondary | ICD-10-CM | POA: Diagnosis not present

## 2020-09-03 DIAGNOSIS — Z6829 Body mass index (BMI) 29.0-29.9, adult: Secondary | ICD-10-CM | POA: Diagnosis not present

## 2020-09-03 DIAGNOSIS — I700 Atherosclerosis of aorta: Secondary | ICD-10-CM | POA: Diagnosis not present

## 2020-09-03 DIAGNOSIS — R7301 Impaired fasting glucose: Secondary | ICD-10-CM | POA: Diagnosis not present

## 2020-09-03 DIAGNOSIS — E782 Mixed hyperlipidemia: Secondary | ICD-10-CM | POA: Diagnosis not present

## 2020-09-03 DIAGNOSIS — I10 Essential (primary) hypertension: Secondary | ICD-10-CM | POA: Diagnosis not present

## 2020-09-03 DIAGNOSIS — M7542 Impingement syndrome of left shoulder: Secondary | ICD-10-CM | POA: Diagnosis not present

## 2020-09-03 DIAGNOSIS — M7541 Impingement syndrome of right shoulder: Secondary | ICD-10-CM | POA: Diagnosis not present

## 2020-09-03 DIAGNOSIS — Z23 Encounter for immunization: Secondary | ICD-10-CM | POA: Diagnosis not present

## 2020-11-17 DIAGNOSIS — D126 Benign neoplasm of colon, unspecified: Secondary | ICD-10-CM | POA: Diagnosis not present

## 2020-12-13 DIAGNOSIS — K219 Gastro-esophageal reflux disease without esophagitis: Secondary | ICD-10-CM | POA: Diagnosis not present

## 2020-12-13 DIAGNOSIS — E7849 Other hyperlipidemia: Secondary | ICD-10-CM | POA: Diagnosis not present

## 2020-12-13 DIAGNOSIS — E871 Hypo-osmolality and hyponatremia: Secondary | ICD-10-CM | POA: Diagnosis not present

## 2020-12-13 DIAGNOSIS — E782 Mixed hyperlipidemia: Secondary | ICD-10-CM | POA: Diagnosis not present

## 2020-12-13 DIAGNOSIS — R7301 Impaired fasting glucose: Secondary | ICD-10-CM | POA: Diagnosis not present

## 2020-12-17 DIAGNOSIS — D1739 Benign lipomatous neoplasm of skin and subcutaneous tissue of other sites: Secondary | ICD-10-CM | POA: Diagnosis not present

## 2020-12-17 DIAGNOSIS — I10 Essential (primary) hypertension: Secondary | ICD-10-CM | POA: Diagnosis not present

## 2020-12-17 DIAGNOSIS — I700 Atherosclerosis of aorta: Secondary | ICD-10-CM | POA: Diagnosis not present

## 2020-12-17 DIAGNOSIS — R7301 Impaired fasting glucose: Secondary | ICD-10-CM | POA: Diagnosis not present

## 2020-12-17 DIAGNOSIS — Z6829 Body mass index (BMI) 29.0-29.9, adult: Secondary | ICD-10-CM | POA: Diagnosis not present

## 2020-12-17 DIAGNOSIS — E7849 Other hyperlipidemia: Secondary | ICD-10-CM | POA: Diagnosis not present

## 2020-12-21 DIAGNOSIS — Z01818 Encounter for other preprocedural examination: Secondary | ICD-10-CM | POA: Diagnosis not present

## 2020-12-23 DIAGNOSIS — Z1211 Encounter for screening for malignant neoplasm of colon: Secondary | ICD-10-CM | POA: Diagnosis not present

## 2020-12-23 DIAGNOSIS — K5730 Diverticulosis of large intestine without perforation or abscess without bleeding: Secondary | ICD-10-CM | POA: Diagnosis not present

## 2020-12-23 DIAGNOSIS — Z7984 Long term (current) use of oral hypoglycemic drugs: Secondary | ICD-10-CM | POA: Diagnosis not present

## 2020-12-23 DIAGNOSIS — K621 Rectal polyp: Secondary | ICD-10-CM | POA: Diagnosis not present

## 2020-12-23 DIAGNOSIS — E119 Type 2 diabetes mellitus without complications: Secondary | ICD-10-CM | POA: Diagnosis not present

## 2020-12-23 DIAGNOSIS — D126 Benign neoplasm of colon, unspecified: Secondary | ICD-10-CM | POA: Diagnosis not present

## 2020-12-23 DIAGNOSIS — I10 Essential (primary) hypertension: Secondary | ICD-10-CM | POA: Diagnosis not present

## 2020-12-23 DIAGNOSIS — K635 Polyp of colon: Secondary | ICD-10-CM | POA: Diagnosis not present

## 2020-12-23 DIAGNOSIS — Z86010 Personal history of colonic polyps: Secondary | ICD-10-CM | POA: Diagnosis not present

## 2021-01-12 DIAGNOSIS — D126 Benign neoplasm of colon, unspecified: Secondary | ICD-10-CM | POA: Diagnosis not present

## 2021-03-18 DIAGNOSIS — R7301 Impaired fasting glucose: Secondary | ICD-10-CM | POA: Diagnosis not present

## 2021-03-18 DIAGNOSIS — E871 Hypo-osmolality and hyponatremia: Secondary | ICD-10-CM | POA: Diagnosis not present

## 2021-03-18 DIAGNOSIS — K219 Gastro-esophageal reflux disease without esophagitis: Secondary | ICD-10-CM | POA: Diagnosis not present

## 2021-03-18 DIAGNOSIS — E782 Mixed hyperlipidemia: Secondary | ICD-10-CM | POA: Diagnosis not present

## 2021-03-18 DIAGNOSIS — E7849 Other hyperlipidemia: Secondary | ICD-10-CM | POA: Diagnosis not present

## 2021-03-18 DIAGNOSIS — R5382 Chronic fatigue, unspecified: Secondary | ICD-10-CM | POA: Diagnosis not present

## 2021-03-28 DIAGNOSIS — E7849 Other hyperlipidemia: Secondary | ICD-10-CM | POA: Diagnosis not present

## 2021-03-28 DIAGNOSIS — I10 Essential (primary) hypertension: Secondary | ICD-10-CM | POA: Diagnosis not present

## 2021-03-28 DIAGNOSIS — R7301 Impaired fasting glucose: Secondary | ICD-10-CM | POA: Diagnosis not present

## 2021-03-28 DIAGNOSIS — I700 Atherosclerosis of aorta: Secondary | ICD-10-CM | POA: Diagnosis not present

## 2021-03-28 DIAGNOSIS — J209 Acute bronchitis, unspecified: Secondary | ICD-10-CM | POA: Diagnosis not present

## 2021-03-28 DIAGNOSIS — Z6827 Body mass index (BMI) 27.0-27.9, adult: Secondary | ICD-10-CM | POA: Diagnosis not present

## 2021-05-16 DIAGNOSIS — Z0001 Encounter for general adult medical examination with abnormal findings: Secondary | ICD-10-CM | POA: Diagnosis not present

## 2021-05-16 DIAGNOSIS — E7849 Other hyperlipidemia: Secondary | ICD-10-CM | POA: Diagnosis not present

## 2021-05-16 DIAGNOSIS — H9319 Tinnitus, unspecified ear: Secondary | ICD-10-CM | POA: Diagnosis not present

## 2021-05-16 DIAGNOSIS — F17210 Nicotine dependence, cigarettes, uncomplicated: Secondary | ICD-10-CM | POA: Diagnosis not present

## 2021-05-16 DIAGNOSIS — I10 Essential (primary) hypertension: Secondary | ICD-10-CM | POA: Diagnosis not present

## 2021-05-16 DIAGNOSIS — I700 Atherosclerosis of aorta: Secondary | ICD-10-CM | POA: Diagnosis not present

## 2021-06-27 DIAGNOSIS — H6123 Impacted cerumen, bilateral: Secondary | ICD-10-CM | POA: Diagnosis not present

## 2021-06-27 DIAGNOSIS — Z6826 Body mass index (BMI) 26.0-26.9, adult: Secondary | ICD-10-CM | POA: Diagnosis not present

## 2021-06-27 DIAGNOSIS — Z23 Encounter for immunization: Secondary | ICD-10-CM | POA: Diagnosis not present

## 2021-06-27 DIAGNOSIS — H9191 Unspecified hearing loss, right ear: Secondary | ICD-10-CM | POA: Diagnosis not present

## 2021-06-27 DIAGNOSIS — E7849 Other hyperlipidemia: Secondary | ICD-10-CM | POA: Diagnosis not present

## 2021-11-08 DIAGNOSIS — K219 Gastro-esophageal reflux disease without esophagitis: Secondary | ICD-10-CM | POA: Diagnosis not present

## 2021-11-08 DIAGNOSIS — E7849 Other hyperlipidemia: Secondary | ICD-10-CM | POA: Diagnosis not present

## 2021-11-08 DIAGNOSIS — E559 Vitamin D deficiency, unspecified: Secondary | ICD-10-CM | POA: Diagnosis not present

## 2021-11-08 DIAGNOSIS — R7301 Impaired fasting glucose: Secondary | ICD-10-CM | POA: Diagnosis not present

## 2021-11-08 DIAGNOSIS — I10 Essential (primary) hypertension: Secondary | ICD-10-CM | POA: Diagnosis not present

## 2021-11-08 DIAGNOSIS — E782 Mixed hyperlipidemia: Secondary | ICD-10-CM | POA: Diagnosis not present

## 2021-11-15 DIAGNOSIS — E7849 Other hyperlipidemia: Secondary | ICD-10-CM | POA: Diagnosis not present

## 2021-11-15 DIAGNOSIS — N401 Enlarged prostate with lower urinary tract symptoms: Secondary | ICD-10-CM | POA: Diagnosis not present

## 2021-11-15 DIAGNOSIS — I10 Essential (primary) hypertension: Secondary | ICD-10-CM | POA: Diagnosis not present

## 2021-11-15 DIAGNOSIS — Z6827 Body mass index (BMI) 27.0-27.9, adult: Secondary | ICD-10-CM | POA: Diagnosis not present

## 2021-11-15 DIAGNOSIS — R7301 Impaired fasting glucose: Secondary | ICD-10-CM | POA: Diagnosis not present

## 2021-11-15 DIAGNOSIS — I700 Atherosclerosis of aorta: Secondary | ICD-10-CM | POA: Diagnosis not present

## 2022-05-22 DIAGNOSIS — J069 Acute upper respiratory infection, unspecified: Secondary | ICD-10-CM | POA: Diagnosis not present

## 2022-05-22 DIAGNOSIS — Z6826 Body mass index (BMI) 26.0-26.9, adult: Secondary | ICD-10-CM | POA: Diagnosis not present

## 2022-05-22 DIAGNOSIS — E871 Hypo-osmolality and hyponatremia: Secondary | ICD-10-CM | POA: Diagnosis not present

## 2022-05-22 DIAGNOSIS — R7301 Impaired fasting glucose: Secondary | ICD-10-CM | POA: Diagnosis not present

## 2022-05-22 DIAGNOSIS — K219 Gastro-esophageal reflux disease without esophagitis: Secondary | ICD-10-CM | POA: Diagnosis not present

## 2022-05-22 DIAGNOSIS — I10 Essential (primary) hypertension: Secondary | ICD-10-CM | POA: Diagnosis not present

## 2022-05-22 DIAGNOSIS — Z20828 Contact with and (suspected) exposure to other viral communicable diseases: Secondary | ICD-10-CM | POA: Diagnosis not present

## 2022-05-22 DIAGNOSIS — E7849 Other hyperlipidemia: Secondary | ICD-10-CM | POA: Diagnosis not present

## 2022-05-22 DIAGNOSIS — N401 Enlarged prostate with lower urinary tract symptoms: Secondary | ICD-10-CM | POA: Diagnosis not present

## 2022-05-25 DIAGNOSIS — N183 Chronic kidney disease, stage 3 unspecified: Secondary | ICD-10-CM | POA: Diagnosis not present

## 2022-05-25 DIAGNOSIS — Z0001 Encounter for general adult medical examination with abnormal findings: Secondary | ICD-10-CM | POA: Diagnosis not present

## 2022-05-25 DIAGNOSIS — N401 Enlarged prostate with lower urinary tract symptoms: Secondary | ICD-10-CM | POA: Diagnosis not present

## 2022-05-25 DIAGNOSIS — E7849 Other hyperlipidemia: Secondary | ICD-10-CM | POA: Diagnosis not present

## 2022-05-25 DIAGNOSIS — R351 Nocturia: Secondary | ICD-10-CM | POA: Diagnosis not present

## 2022-05-25 DIAGNOSIS — R7301 Impaired fasting glucose: Secondary | ICD-10-CM | POA: Diagnosis not present

## 2022-05-25 DIAGNOSIS — M7541 Impingement syndrome of right shoulder: Secondary | ICD-10-CM | POA: Diagnosis not present

## 2022-05-25 DIAGNOSIS — I10 Essential (primary) hypertension: Secondary | ICD-10-CM | POA: Diagnosis not present

## 2022-05-25 DIAGNOSIS — I700 Atherosclerosis of aorta: Secondary | ICD-10-CM | POA: Diagnosis not present

## 2022-09-19 DIAGNOSIS — M7542 Impingement syndrome of left shoulder: Secondary | ICD-10-CM | POA: Diagnosis not present

## 2022-09-19 DIAGNOSIS — N1831 Chronic kidney disease, stage 3a: Secondary | ICD-10-CM | POA: Diagnosis not present

## 2022-09-19 DIAGNOSIS — I10 Essential (primary) hypertension: Secondary | ICD-10-CM | POA: Diagnosis not present

## 2022-09-19 DIAGNOSIS — Z6828 Body mass index (BMI) 28.0-28.9, adult: Secondary | ICD-10-CM | POA: Diagnosis not present

## 2022-09-19 DIAGNOSIS — M7541 Impingement syndrome of right shoulder: Secondary | ICD-10-CM | POA: Diagnosis not present

## 2022-09-19 DIAGNOSIS — M19011 Primary osteoarthritis, right shoulder: Secondary | ICD-10-CM | POA: Diagnosis not present

## 2022-09-19 DIAGNOSIS — Z23 Encounter for immunization: Secondary | ICD-10-CM | POA: Diagnosis not present

## 2022-09-19 DIAGNOSIS — M19012 Primary osteoarthritis, left shoulder: Secondary | ICD-10-CM | POA: Diagnosis not present

## 2022-11-06 DIAGNOSIS — I10 Essential (primary) hypertension: Secondary | ICD-10-CM | POA: Diagnosis not present

## 2022-11-06 DIAGNOSIS — R7301 Impaired fasting glucose: Secondary | ICD-10-CM | POA: Diagnosis not present

## 2022-11-06 DIAGNOSIS — E7849 Other hyperlipidemia: Secondary | ICD-10-CM | POA: Diagnosis not present

## 2022-11-06 DIAGNOSIS — E871 Hypo-osmolality and hyponatremia: Secondary | ICD-10-CM | POA: Diagnosis not present

## 2022-11-06 DIAGNOSIS — N1831 Chronic kidney disease, stage 3a: Secondary | ICD-10-CM | POA: Diagnosis not present

## 2022-11-06 DIAGNOSIS — E559 Vitamin D deficiency, unspecified: Secondary | ICD-10-CM | POA: Diagnosis not present

## 2022-11-06 DIAGNOSIS — K219 Gastro-esophageal reflux disease without esophagitis: Secondary | ICD-10-CM | POA: Diagnosis not present

## 2022-11-13 DIAGNOSIS — N401 Enlarged prostate with lower urinary tract symptoms: Secondary | ICD-10-CM | POA: Diagnosis not present

## 2022-11-13 DIAGNOSIS — Z6828 Body mass index (BMI) 28.0-28.9, adult: Secondary | ICD-10-CM | POA: Diagnosis not present

## 2022-11-13 DIAGNOSIS — M19011 Primary osteoarthritis, right shoulder: Secondary | ICD-10-CM | POA: Diagnosis not present

## 2022-11-13 DIAGNOSIS — I10 Essential (primary) hypertension: Secondary | ICD-10-CM | POA: Diagnosis not present

## 2022-11-13 DIAGNOSIS — R1310 Dysphagia, unspecified: Secondary | ICD-10-CM | POA: Diagnosis not present

## 2022-11-13 DIAGNOSIS — N1831 Chronic kidney disease, stage 3a: Secondary | ICD-10-CM | POA: Diagnosis not present

## 2022-11-13 DIAGNOSIS — E7849 Other hyperlipidemia: Secondary | ICD-10-CM | POA: Diagnosis not present

## 2022-11-13 DIAGNOSIS — I700 Atherosclerosis of aorta: Secondary | ICD-10-CM | POA: Diagnosis not present

## 2022-11-15 ENCOUNTER — Encounter (INDEPENDENT_AMBULATORY_CARE_PROVIDER_SITE_OTHER): Payer: Self-pay | Admitting: *Deleted

## 2022-11-23 ENCOUNTER — Encounter (INDEPENDENT_AMBULATORY_CARE_PROVIDER_SITE_OTHER): Payer: Self-pay | Admitting: Gastroenterology

## 2022-11-23 ENCOUNTER — Ambulatory Visit (INDEPENDENT_AMBULATORY_CARE_PROVIDER_SITE_OTHER): Payer: Medicare PPO | Admitting: Gastroenterology

## 2022-11-23 VITALS — BP 175/79 | HR 63 | Temp 97.1°F | Ht 72.0 in | Wt 206.1 lb

## 2022-11-23 DIAGNOSIS — R1310 Dysphagia, unspecified: Secondary | ICD-10-CM | POA: Insufficient documentation

## 2022-11-23 DIAGNOSIS — R079 Chest pain, unspecified: Secondary | ICD-10-CM | POA: Insufficient documentation

## 2022-11-23 DIAGNOSIS — K219 Gastro-esophageal reflux disease without esophagitis: Secondary | ICD-10-CM

## 2022-11-23 DIAGNOSIS — R1319 Other dysphagia: Secondary | ICD-10-CM

## 2022-11-23 NOTE — Progress Notes (Signed)
Leonard Flores, M.D. Gastroenterology & Hepatology [facility] [address] [place], [place] [address] Primary Care Physician: Curlene Labrum, MD [place] [id number]  Referring MD: PCP  Chief Complaint: Dysphagia and chest pressure  History of Present Illness: Leonard Flores is a 82 y.o. male with past medical history of diabetes, hyperlipidemia and hypertension, who presents for evaluation of dysphagia and chest pressure.  Patient reports that possibly for the last 10 years he has presented recurrent episodes of pressure in his chest. States that he has presented worsening of the symptoms of chest tightness intermittently - he has even thought this was related to a heart attack.  However, he has been more concerned as the chest pain has been present more often than in the past.  He reports that he is not having exertional chest pain. Denies any SOB with exertion or even at rest.   He reports that when he swallows food he feels the food is taking longer to go down, he feels that "he is feeling well it goes down my esophagus". A few times he has felt the food is not going down and he has to burp and vomit it. This has happened especially with potatoes or meat.  No episodes of food impaction.  He has been taking omeprazole 40 mg every day in AM for GERD, takes breakfast before taking the medication. States that if he misses a dose, he will have recurrent burping and heartburn. He may still have a mild burning in the retrosternal area event though he takes the medication compliantly.  The patient denies having any nausea, fever, chills, hematochezia, melena, hematemesis, abdominal distention, abdominal pain, diarrhea, jaundice, pruritus or weight loss.  Brings reports of most recent reports of most recent blood workup from 11/06/2022.  CBC showed white blood cell count of 7.2, hemoglobin 14.4 and platelets 261, CMP with sodium 137,  potassium 4.3, glucose 88, chloride 99, creatinine 1.16, alkaline phosphatase 89, total bili 0.7, AST 15, ALT 13.  Last EGD: many years ago, cannot remember how long ago Last Colonoscopy:12/23/2020 - performed by Dr. Valeria Batman - had 8 polyps per patient, tubular adenomas, no report available  Recommended repeat in 3 years  FHx: neg for any gastrointestinal/liver disease, brothers x2 lung and sister breast ance Social: neg smoking, alcohol or illicit drug use Surgical: no abdominal surgeries  Past Medical History: Past Medical History:  Diagnosis Date   Diabetes mellitus without complication ([place])    Elevated cholesterol    Hypertension     Past Surgical History: Past Surgical History:  Procedure Laterality Date   BACK SURGERY     TUMOR REMOVAL      Family History:History reviewed. No pertinent family history.  Social History: Social History   Tobacco Use  Smoking Status Never  Smokeless Tobacco Never   Social History   Substance and Sexual Activity  Alcohol Use No   Social History   Substance and Sexual Activity  Drug Use No    Allergies: Allergies  Allergen Reactions   Penicillins Itching    Did it involve swelling of the face/tongue/throat, SOB, or low BP? No Did it involve sudden or severe rash/hives, skin peeling, or any reaction on the inside of your mouth or nose? Yes Did you need to seek medical attention at a hospital or doctor's office? No When did it last happen?   Over 10 years ago If all above answers are "NO", may proceed  with cephalosporin use.    Medications: Current Outpatient Medications  Medication Sig Dispense Refill   atorvastatin (LIPITOR) 20 MG tablet Take 20 mg by mouth daily.     ibuprofen (ADVIL,MOTRIN) 200 MG tablet Take 600 mg by mouth every 6 (six) hours as needed.     metFORMIN (GLUCOPHAGE) 500 MG tablet Take 500 mg by mouth 2 (two) times daily.     metoprolol (LOPRESSOR) 50 MG tablet Take 50 mg by mouth 2 (two) times daily.      omeprazole (PRILOSEC) 40 MG capsule Take 40 mg by mouth daily.     tamsulosin (FLOMAX) 0.4 MG CAPS capsule Take 0.4 mg by mouth daily.     No current facility-administered medications for this visit.    Review of Systems: GENERAL: negative for malaise, night sweats HEENT: No changes in hearing or vision, no nose bleeds or other nasal problems. NECK: Negative for lumps, goiter, pain and significant neck swelling RESPIRATORY: Negative for cough, wheezing CARDIOVASCULAR: Negative for chest pain, leg swelling, palpitations, orthopnea GI: SEE HPI MUSCULOSKELETAL: Negative for joint pain or swelling, back pain, and muscle pain. SKIN: Negative for lesions, rash PSYCH: Negative for sleep disturbance, mood disorder and recent psychosocial stressors. HEMATOLOGY Negative for prolonged bleeding, bruising easily, and swollen nodes. ENDOCRINE: Negative for cold or heat intolerance, polyuria, polydipsia and goiter. NEURO: negative for tremor, gait imbalance, syncope and seizures. The remainder of the review of systems is noncontributory.   Physical Exam: BP (!) 175/79 (BP Location: Left Arm, Patient Position: Sitting, Cuff Size: Normal)   Pulse 63   Temp (!) 97.1 F (36.2 C) (Temporal)   Ht 6' (1.829 m)   Wt 206 lb 1.6 oz (93.5 kg)   BMI 27.95 kg/m  GENERAL: The patient is AO x3, in no acute distress. HEENT: Head is normocephalic and atraumatic. EOMI are intact. Mouth is well hydrated and without lesions. NECK: Supple. No masses LUNGS: Clear to auscultation. No presence of rhonchi/wheezing/rales. Adequate chest expansion HEART: RRR, normal s1 and s2. ABDOMEN: Soft, nontender, no guarding, no peritoneal signs, and nondistended. BS +. No masses. EXTREMITIES: Without any cyanosis, clubbing, rash, lesions or edema. NEUROLOGIC: AOx3, no focal motor deficit. SKIN: no jaundice, no rashes   Imaging/Labs: as above  I personally reviewed and interpreted the available labs, imaging and  endoscopic files.  Impression and Plan: [name] is a 82 y.o. male with past medical history of diabetes, hyperlipidemia and hypertension, who presents for evaluation of dysphagia and chest pressure.  The patient has presented recurrent episodes of chest pain for the last 10 years without typical exertional symptoms.  However, he reports that the pain has been more frequent than in the past.  Given his age and comorbidities I consider it is important to rule out a cardiac etiology prior to committing to a primary gastroenterological condition explaining for his symptoms.  Nevertheless, I explained to the patient that it is likely that at the very least he has a concomitant esophageal disease causing part of his symptoms.  It seems that some of his symptoms are related to GERD and possible stricturing disease due to this, for which I advised him to take omeprazole on an adequate fashion.  We will refer him for evaluation by cardiology and once he has been cleared, we will proceed with an EGD with possible dilation of his esophagus.  Patient understood and agreed.  The patient was found to have elevated blood pressure when vital signs were checked in the office. The  blood pressure was rechecked by the nursing staff and it was found be persistently elevated >140/90 mmHg. I personally advised to the patient to follow up closely with PCP for hypertension control.  -Cardiology evaluation for preprocedural clearance -Schedule EGD once cardiology clearance has been obtained -Continue omeprazole 40 mg qday -Explained presumed etiology of reflux symptoms. Instruction provided in the use of antireflux medication - patient should take medication in the morning 30-45 minutes before eating breakfast. Discussed avoidance of eating within 2 hours of lying down to sleep and benefit of blocks to elevate head of bed. Also, will benefit from avoiding carbonated drinks/sodas or food that has tomatoes, spicy or greasy  food. -Repeat colonoscopy in 12/2023  All questions were answered.      Leonard Peppers, MD Gastroenterology and Hepatology [facility]

## 2022-11-23 NOTE — Patient Instructions (Addendum)
Cardiology evaluation for preprocedural clearance Schedule EGD once cardiology clearance has been obtained Continue omeprazole 40 mg qday Explained presumed etiology of reflux symptoms. Instruction provided in the use of antireflux medication - patient should take medication in the morning 30-45 minutes before eating breakfast. Discussed avoidance of eating within 2 hours of lying down to sleep and benefit of blocks to elevate head of bed. Also, will benefit from avoiding carbonated drinks/sodas or food that has tomatoes, spicy or greasy food. Repeat colonoscopy in 12/2023 The patient was found to have elevated blood pressure when vital signs were checked in the office. The blood pressure was rechecked by the nursing staff and it was found be persistently elevated >140/90 mmHg. I personally advised to the patient to follow up closely with PCP for hypertension control.

## 2022-12-03 ENCOUNTER — Emergency Department (HOSPITAL_COMMUNITY): Payer: Medicare PPO

## 2022-12-03 ENCOUNTER — Encounter (HOSPITAL_COMMUNITY): Payer: Self-pay

## 2022-12-03 ENCOUNTER — Other Ambulatory Visit: Payer: Self-pay

## 2022-12-03 ENCOUNTER — Emergency Department (HOSPITAL_COMMUNITY)
Admission: EM | Admit: 2022-12-03 | Discharge: 2022-12-03 | Disposition: A | Discharge status: Home / Self Care | Payer: Medicare PPO | Attending: Student | Admitting: Student

## 2022-12-03 DIAGNOSIS — R944 Abnormal results of kidney function studies: Secondary | ICD-10-CM | POA: Insufficient documentation

## 2022-12-03 DIAGNOSIS — I10 Essential (primary) hypertension: Secondary | ICD-10-CM | POA: Diagnosis not present

## 2022-12-03 DIAGNOSIS — I159 Secondary hypertension, unspecified: Secondary | ICD-10-CM | POA: Insufficient documentation

## 2022-12-03 DIAGNOSIS — Z7984 Long term (current) use of oral hypoglycemic drugs: Secondary | ICD-10-CM | POA: Diagnosis not present

## 2022-12-03 DIAGNOSIS — E119 Type 2 diabetes mellitus without complications: Secondary | ICD-10-CM | POA: Insufficient documentation

## 2022-12-03 DIAGNOSIS — R079 Chest pain, unspecified: Secondary | ICD-10-CM | POA: Diagnosis not present

## 2022-12-03 DIAGNOSIS — Z79899 Other long term (current) drug therapy: Secondary | ICD-10-CM | POA: Diagnosis not present

## 2022-12-03 DIAGNOSIS — R030 Elevated blood-pressure reading, without diagnosis of hypertension: Secondary | ICD-10-CM | POA: Diagnosis present

## 2022-12-03 LAB — CBC WITH DIFFERENTIAL/PLATELET
Abs Immature Granulocytes: 0.01 10*3/uL (ref 0.00–0.07)
Basophils Absolute: 0 10*3/uL (ref 0.0–0.1)
Basophils Relative: 0 %
Eosinophils Absolute: 0.1 10*3/uL (ref 0.0–0.5)
Eosinophils Relative: 2 %
HCT: 40.1 % (ref 39.0–52.0)
Hemoglobin: 13.5 g/dL (ref 13.0–17.0)
Immature Granulocytes: 0 %
Lymphocytes Relative: 40 %
Lymphs Abs: 2.5 10*3/uL (ref 0.7–4.0)
MCH: 28.7 pg (ref 26.0–34.0)
MCHC: 33.7 g/dL (ref 30.0–36.0)
MCV: 85.3 fL (ref 80.0–100.0)
Monocytes Absolute: 0.6 10*3/uL (ref 0.1–1.0)
Monocytes Relative: 10 %
Neutro Abs: 3.1 10*3/uL (ref 1.7–7.7)
Neutrophils Relative %: 48 %
Platelets: 264 10*3/uL (ref 150–400)
RBC: 4.7 MIL/uL (ref 4.22–5.81)
RDW: 12.9 % (ref 11.5–15.5)
WBC: 6.3 10*3/uL (ref 4.0–10.5)
nRBC: 0 % (ref 0.0–0.2)

## 2022-12-03 LAB — TROPONIN I (HIGH SENSITIVITY): Troponin I (High Sensitivity): 8 ng/L (ref <18)

## 2022-12-03 LAB — COMPREHENSIVE METABOLIC PANEL
ALT: 35 U/L (ref 0–44)
AST: 27 U/L (ref 15–41)
Albumin: 4.2 g/dL (ref 3.5–5.0)
Alkaline Phosphatase: 74 U/L (ref 38–126)
Anion gap: 7 (ref 5–15)
BUN: 20 mg/dL (ref 8–23)
CO2: 26 mmol/L (ref 22–32)
Calcium: 9.1 mg/dL (ref 8.9–10.3)
Chloride: 102 mmol/L (ref 98–111)
Creatinine, Ser: 1.52 mg/dL — ABNORMAL HIGH (ref 0.61–1.24)
GFR, Estimated: 46 mL/min — ABNORMAL LOW (ref >60)
Glucose, Bld: 101 mg/dL — ABNORMAL HIGH (ref 70–99)
Potassium: 3.6 mmol/L (ref 3.5–5.1)
Sodium: 135 mmol/L (ref 135–145)
Total Bilirubin: 0.8 mg/dL (ref 0.3–1.2)
Total Protein: 6.8 g/dL (ref 6.5–8.1)

## 2022-12-03 MED ORDER — METOPROLOL TARTRATE 50 MG PO TABS
50.0000 mg | ORAL_TABLET | Freq: Two times a day (BID) | ORAL | Status: DC
  Administered 2022-12-03: 50 mg via ORAL
  Filled 2022-12-03: qty 1

## 2022-12-03 NOTE — ED Triage Notes (Signed)
Pt reports he had elevated BP when he went to the doctor to see about having an EGD and he has been checking his BP every day since and it has been elevated.  Pt forgot to take his evening dose of BP meds tonight since he came to the ER.

## 2022-12-04 DIAGNOSIS — I10 Essential (primary) hypertension: Secondary | ICD-10-CM | POA: Diagnosis not present

## 2022-12-04 DIAGNOSIS — Z6827 Body mass index (BMI) 27.0-27.9, adult: Secondary | ICD-10-CM | POA: Diagnosis not present

## 2022-12-04 DIAGNOSIS — E7849 Other hyperlipidemia: Secondary | ICD-10-CM | POA: Diagnosis not present

## 2022-12-04 DIAGNOSIS — R079 Chest pain, unspecified: Secondary | ICD-10-CM | POA: Diagnosis not present

## 2022-12-04 DIAGNOSIS — K219 Gastro-esophageal reflux disease without esophagitis: Secondary | ICD-10-CM | POA: Diagnosis not present

## 2022-12-04 DIAGNOSIS — I700 Atherosclerosis of aorta: Secondary | ICD-10-CM | POA: Diagnosis not present

## 2022-12-04 DIAGNOSIS — N1831 Chronic kidney disease, stage 3a: Secondary | ICD-10-CM | POA: Diagnosis not present

## 2022-12-04 DIAGNOSIS — E1169 Type 2 diabetes mellitus with other specified complication: Secondary | ICD-10-CM | POA: Diagnosis not present

## 2022-12-04 DIAGNOSIS — E7801 Familial hypercholesterolemia: Secondary | ICD-10-CM | POA: Diagnosis not present

## 2022-12-04 NOTE — ED Provider Notes (Signed)
[facility] Provider Note  CSN: [id number] Arrival date & time: 12/03/22 2007  Chief Complaint(s) Hypertension  HPI Leonard Flores is a 82 y.o. male with PMH of HTN, HLD, T2DM, GERD who presents emergency department for evaluation of elevated blood pressures.  Patient states that this morning he awoke with an abnormal sensation of chest tightness and on the left side of his chest that radiated into his shoulder.  He took his blood pressure and found it that his systolic blood pressure was greater than 180 and comes to the emergency department due to concern for persistent hypertension.  He states that he takes Lopressor 50 mg twice daily but has not taken his dose of Lopressor prior to coming the emergency department today.  Currently in the emergency department he is symptom-free denying any chest pain, shortness of breath, abdominal pain, nausea, vomiting or any other systemic symptoms.  Denies numbness, tingling, weakness or other neurologic complaints.   Past Medical History Past Medical History:  Diagnosis Date   Diabetes mellitus without complication ([place])    Elevated cholesterol    Hypertension    Patient Active Problem List   Diagnosis Date Noted   Dysphagia 11/23/2022   GERD (gastroesophageal reflux disease) 11/23/2022   Chest pain 11/23/2022   Calculus of gallbladder with acute cholecystitis without obstruction 09/13/2019   Diabetes mellitus without complication ([place])    Leukocytosis    Hypertension    Abdominal pain 09/12/2019   Home Medication(s) Prior to Admission medications   Medication Sig Start Date End Date Taking? Authorizing Provider  atorvastatin (LIPITOR) 20 MG tablet Take 20 mg by mouth daily. 10/01/13   [provider]  ibuprofen (ADVIL,MOTRIN) 200 MG tablet Take 600 mg by mouth every 6 (six) hours as needed.    [provider]  metFORMIN (GLUCOPHAGE) 500 MG tablet Take 500 mg by mouth 2 (two)  times daily. 09/05/19   [provider]  metoprolol (LOPRESSOR) 50 MG tablet Take 50 mg by mouth 2 (two) times daily.    [provider]  omeprazole (PRILOSEC) 40 MG capsule Take 40 mg by mouth daily.    [provider]  tamsulosin (FLOMAX) 0.4 MG CAPS capsule Take 0.4 mg by mouth daily. 08/27/19   [provider]                                                                                                                                    Past Surgical History Past Surgical History:  Procedure Laterality Date   BACK SURGERY     TUMOR REMOVAL     Family History History reviewed. No pertinent family history.  Social History Social History   Tobacco Use   Smoking status: Never   Smokeless tobacco: Never  Vaping Use   Vaping Use: Never used  Substance Use Topics   Alcohol use: No   Drug use: No  Allergies Penicillins  Review of Systems Review of Systems  Respiratory:  Positive for chest tightness.     Physical Exam Vital Signs  I have reviewed the triage vital signs BP (!) 154/80   Pulse 62   Temp 98.1 F (36.7 C)   Resp 20   Ht 6' (1.829 m)   Wt 93.5 kg   SpO2 96%   BMI 27.95 kg/m   Physical Exam Constitutional:      General: He is not in acute distress.    Appearance: Normal appearance.  HENT:     Head: Normocephalic and atraumatic.     Nose: No congestion or rhinorrhea.  Eyes:     General:        Right eye: No discharge.        Left eye: No discharge.     Extraocular Movements: Extraocular movements intact.     Pupils: Pupils are equal, round, and reactive to light.  Cardiovascular:     Rate and Rhythm: Normal rate and regular rhythm.     Heart sounds: No murmur heard. Pulmonary:     Effort: No respiratory distress.     Breath sounds: No wheezing or rales.  Abdominal:     General: There is no distension.     Tenderness: There is no abdominal tenderness.  Musculoskeletal:        General: Normal range of  motion.     Cervical back: Normal range of motion.  Skin:    General: Skin is warm and dry.  Neurological:     General: No focal deficit present.     Mental Status: He is alert.     ED Results and Treatments Labs (all labs ordered are listed, but only abnormal results are displayed) Labs Reviewed  COMPREHENSIVE METABOLIC PANEL - Abnormal; Notable for the following components:      Result Value   Glucose, Bld 101 (*)    Creatinine, Ser 1.52 (*)    GFR, Estimated 46 (*)    All other components within normal limits  CBC WITH DIFFERENTIAL/PLATELET  TROPONIN I (HIGH SENSITIVITY)                                                                                                                          Radiology DG Chest Portable 1 View  Result Date: 12/03/2022 CLINICAL DATA:  Chest pain. EXAM: PORTABLE CHEST 1 VIEW COMPARISON:  12/26/2006. FINDINGS: The heart size and mediastinal contours are within normal limits. There is atherosclerotic calcification of the aorta. Lung volumes are low with mild atelectasis or scarring at the left lung base. No effusion or pneumothorax. No acute osseous abnormality. IMPRESSION: Minimal atelectasis or scarring at the left lung base. Electronically Signed   By: Brett Fairy M.D.   On: 12/03/2022 21:53    Pertinent labs & imaging results that were available during my care of the patient were reviewed by me and considered in my medical decision making (see MDM for details).  Medications Ordered in ED Medications - No data to display                                                                                                                                   Procedures Procedures  (including critical care time)  Medical Decision Making / ED Course   This patient presents to the ED for concern of hypertension, this involves an extensive number of treatment options, and is a complaint that carries with it a high risk of complications and morbidity.  The  differential diagnosis includes hypertensive urgency, hypertensive emergency, electrolyte abnormality, renal artery stenosis, ACS, GERD  MDM: Patient seen emergency room for evaluation of hypertension and chest tightness.  Physical exam is unremarkable here in the emergency room today.  Initial blood pressures here in the emergency department 174/89 and after receiving his scheduled home Lopressor improved to 154/80.  Laboratory evaluation reassuring in the emergency department with a normal high-sensitivity troponin and creatinine is 1.5.  Unfortunately do not have a recent baseline for comparison and patient will need to follow-up outpatient with his primary care physician.  I did encourage aggressive fluid intake at home.  Current EM literature advises against aggressive blood pressure control in the emergency department in the absence of symptoms as this is not shown to have clinical benefit and should be managed in the outpatient setting ([place] 2023).  Thus, patient discharged with outpatient follow-up.  In regards the patient's chest tightness, he is completely asymptomatic here in the emergency department today and with normal troponins I have overall low suspicion for hypertensive emergency or acute coronary syndrome at this time.  Patient then discharged    Additional history obtained: -Additional history obtained from family member -External records from outside source obtained and reviewed including: Chart review including previous notes, labs, imaging, consultation notes   Lab Tests: -I ordered, reviewed, and interpreted labs.   The pertinent results include:   Labs Reviewed  COMPREHENSIVE METABOLIC PANEL - Abnormal; Notable for the following components:      Result Value   Glucose, Bld 101 (*)    Creatinine, Ser 1.52 (*)    GFR, Estimated 46 (*)    All other components within normal limits  CBC WITH DIFFERENTIAL/PLATELET  TROPONIN I (HIGH SENSITIVITY)      EKG   EKG  Interpretation  Date/Time:    Ventricular Rate:    PR Interval:    QRS Duration:   QT Interval:    QTC Calculation:   R Axis:     Text Interpretation:           Imaging Studies ordered: I ordered imaging studies including chest x-ray I independently visualized and interpreted imaging. I agree with the radiologist interpretation   Medicines ordered and prescription drug management: Meds ordered this encounter  Medications   DISCONTD: metoprolol tartrate (LOPRESSOR) tablet 50 mg    -I have reviewed the  patients home medicines and have made adjustments as needed  Critical interventions none    Cardiac Monitoring: The patient was maintained on a cardiac monitor.  I personally viewed and interpreted the cardiac monitored which showed an underlying rhythm of: NSR  Social Determinants of Health:  Factors impacting patients care include: none   Reevaluation: After the interventions noted above, I reevaluated the patient and found that they have :stayed the same  Co morbidities that complicate the patient evaluation  Past Medical History:  Diagnosis Date   Diabetes mellitus without complication ([place])    Elevated cholesterol    Hypertension       Dispostion: I considered admission for this patient, but at this time he does not meet inpatient criteria for admission and he is safe for discharge with outpatient follow-up     Final Clinical Impression(s) / ED Diagnoses Final diagnoses:  Secondary hypertension     '@PCDICTATION'$ @    Jonmichael Beadnell, Debe Coder, MD 12/04/22 1151

## 2022-12-07 ENCOUNTER — Telehealth (INDEPENDENT_AMBULATORY_CARE_PROVIDER_SITE_OTHER): Payer: Self-pay | Admitting: Gastroenterology

## 2022-12-07 NOTE — Telephone Encounter (Signed)
Primary Cardiologist:None  Chart reviewed as part of pre-operative protocol coverage. Because of Leonard Flores's past medical history and time since last visit, he/she will require a follow-up visit in order to better assess preoperative cardiovascular risk.  Pre-op covering staff: - Patient has a new patient appointment with Dr. Dellia Cloud on 12/27/22 at which time clearance can be addressed. - Please contact requesting surgeon's office via preferred method (i.e, phone, fax) to inform them of need for appointment prior to surgery.  If applicable, this message will also be routed to pharmacy pool and/or primary cardiologist for input on holding anticoagulant/antiplatelet agent as requested below so that this information is available at time of patient's appointment.   Emmaline Life, NP-C  12/07/2022, 4:11 PM [address], [address] Office [phone] Fax [phone]

## 2022-12-07 NOTE — Telephone Encounter (Signed)
    12/07/22  Leonard Flores 28-Nov-1940  What type of surgery is being performed? EGD with ED   When is surgery scheduled? TBD  What type of clearance is required (medical or pharmacy to hold medication or both? Cardiac Clearance    Name of physician performing surgery?  Dr. Maylon Peppers [facility] Phone: [phone] Fax: [phone]  Anethesia type (none, local, MAC, general)? MAC

## 2022-12-27 ENCOUNTER — Encounter: Payer: Self-pay | Admitting: Internal Medicine

## 2022-12-27 ENCOUNTER — Ambulatory Visit: Payer: Medicare PPO | Attending: Internal Medicine | Admitting: Internal Medicine

## 2022-12-27 VITALS — BP 138/86 | HR 68 | Ht 72.0 in | Wt 199.0 lb

## 2022-12-27 DIAGNOSIS — Z136 Encounter for screening for cardiovascular disorders: Secondary | ICD-10-CM | POA: Diagnosis not present

## 2022-12-27 DIAGNOSIS — Z01810 Encounter for preprocedural cardiovascular examination: Secondary | ICD-10-CM | POA: Insufficient documentation

## 2022-12-27 DIAGNOSIS — E785 Hyperlipidemia, unspecified: Secondary | ICD-10-CM | POA: Insufficient documentation

## 2022-12-27 DIAGNOSIS — I10 Essential (primary) hypertension: Secondary | ICD-10-CM | POA: Diagnosis not present

## 2022-12-27 DIAGNOSIS — E7849 Other hyperlipidemia: Secondary | ICD-10-CM | POA: Diagnosis not present

## 2022-12-27 DIAGNOSIS — H26492 Other secondary cataract, left eye: Secondary | ICD-10-CM | POA: Diagnosis not present

## 2022-12-27 NOTE — Patient Instructions (Addendum)
Medication Instructions:  Your physician recommends that you continue on your current medications as directed. Please refer to the Current Medication list given to you today.  Labwork: none  Testing/Procedures: Calcium Score CT  Follow-Up: Your physician recommends that you schedule a follow-up appointment in: as needed  Any Other Special Instructions Will Be Listed Below (If Applicable).  If you need a refill on your cardiac medications before your next appointment, please call your pharmacy. 

## 2022-12-27 NOTE — Progress Notes (Signed)
Cardiology Office Note  Date: 12/27/2022   ID: Leonard Flores 07/30/1941, MRN [id number]  PCP:  Leonard Labrum, MD  Cardiologist:  None Electrophysiologist:  None   Reason for Office Visit:  Chief Complaint  Patient presents with   New Patient (Initial Visit)        Chest Pain    History of Present Illness: Leonard Flores is a 82 y.o. male known to have HTN, HLD, prediabetes (became diabetic few months ago), GERD was referred to cardiology clinic for preop cardiac risk stratification for EGD due to chest pain.  Patient lives with his wife at home. He has his daughter and granddaughter living next to his house. He is also sole caretaker of his wife and said has been under a lot of stress recently.  He had ER visit on 12/03/2022 for elevated blood pressure, BP 180/110s mm Hg. after arriving to the ER, he had mild discomfort in his chest radiating to left shoulder that resolved spontaneously. He has been having substernal chest discomfort (similar to burning sensation) x many years, especially at rest or when he lays on his back, intermittent throughout the day, does not occur with exertion and resolve spontaneously.  He takes omeprazole 40 mg in the a.m. and the burning sensation in the chest resolves for a few hours but recurs again intermittently throughout the day.  Sometimes has difficulty swallowing food and feels like he was getting choked. Also has neck and bilateral shoulder arthritis issues for which he gets cortisone shots once in 3 months. He does have neck discomfort radiating to left shoulder and sometimes to bilateral shoulders. This has been ongoing for many years as well. He is physically active at baseline, does all household chores, cuts grass at his house and also at his daughter/granddaughter's houses, yard work with no worsening of the symptoms above.  He also said he underwent LHC 15 to 20 years ago at [facility] and was told he had no blockages.   Past  Medical History:  Diagnosis Date   Diabetes mellitus without complication (HCC)    Elevated cholesterol    Hypertension     Past Surgical History:  Procedure Laterality Date   BACK SURGERY     TUMOR REMOVAL      Current Outpatient Medications  Medication Sig Dispense Refill   amLODipine (NORVASC) 5 MG tablet Take 5 mg by mouth daily.     atorvastatin (LIPITOR) 20 MG tablet Take 20 mg by mouth daily.     ibuprofen (ADVIL,MOTRIN) 200 MG tablet Take 600 mg by mouth every 6 (six) hours as needed.     metFORMIN (GLUCOPHAGE) 500 MG tablet Take 500 mg by mouth 2 (two) times daily.     metoprolol (LOPRESSOR) 50 MG tablet Take 50 mg by mouth 2 (two) times daily.     omeprazole (PRILOSEC) 40 MG capsule Take 40 mg by mouth daily.     tamsulosin (FLOMAX) 0.4 MG CAPS capsule Take 0.4 mg by mouth daily.     tamsulosin (FLOMAX) 0.4 MG CAPS capsule Take 0.4 mg by mouth daily.     No current facility-administered medications for this visit.   Allergies:  Penicillins   Social History: The patient  reports that he has never smoked. He has never used smokeless tobacco. He reports that he does not drink alcohol and does not use drugs.   Family History: The patient's family history includes Aneurysm in his mother; Diabetes in his mother.  ROS:  Please see the history of present illness. Otherwise, complete review of systems is positive for none.  All other systems are reviewed and negative.   Physical Exam: VS:  BP 138/86 (BP Location: Left Arm, Patient Position: Sitting, Cuff Size: Normal)   Pulse 68   Ht 6' (1.829 m)   Wt 199 lb (90.3 kg)   BMI 26.99 kg/m , BMI Body mass index is 26.99 kg/m.  Wt Readings from Last 3 Encounters:  12/27/22 199 lb (90.3 kg)  12/03/22 206 lb 1.6 oz (93.5 kg)  11/23/22 206 lb 1.6 oz (93.5 kg)    General: Patient appears comfortable at rest. HEENT: Conjunctiva and lids normal, oropharynx clear with moist mucosa. Neck: Supple, no elevated JVP or carotid  bruits, no thyromegaly. Lungs: Clear to auscultation, nonlabored breathing at rest. Cardiac: Regular rate and rhythm, no S3 or significant systolic murmur, no pericardial rub. Abdomen: Soft, nontender, no hepatomegaly, bowel sounds present, no guarding or rebound. Extremities: No pitting edema, distal pulses 2+. Skin: Warm and dry. Musculoskeletal: No kyphosis. Neuropsychiatric: Alert and oriented x3, affect grossly appropriate.  ECG:  An ECG dated 12/27/2022 was personally reviewed today and demonstrated:  Normal sinus rhythm, no ST-T changes  Recent Labwork: 12/03/2022: ALT 35; AST 27; BUN 20; Creatinine, Ser 1.52; Hemoglobin 13.5; Platelets 264; Potassium 3.6; Sodium 135  No results found for: "CHOL", "TRIG", "HDL", "CHOLHDL", "VLDL", "LDLCALC", "LDLDIRECT"  Other Studies Reviewed Today:   Assessment and Plan: Patient is 82 year old M known to have HTN, HLD, prediabetes (became diabetic a few months ago), GERD was referred to cardiology clinic for preop clearance of EGD due to chest pain.  # Preop cardiac risk stratification for EGD -Patient has substernal burning discomfort in the center of his chest for many years that happens especially at rest or laying on his back but does not get worse with exertion. He also has neck discomfort radiating to left shoulder and sometimes bilateral shoulders likely from arthritis of his neck and bilateral shoulders. This was ongoing for many years as well and no worsening of symptoms were noted with exertion. Patient is physically active at baseline, has almost 10 METS. I believe the above symptoms of substernal burning discomfort and neck discomfort radiating to left shoulder are likely secondary to longstanding GERD and cervical radiculopathy respectively. Given the symptoms are occurring at rest and not with exertion is reassuring and also he underwent LHC 15 years ago at [facility] that showed no blockages. I do not think any of his symptoms are ischemic  in nature. EKG reviewed from 12/03/2022 and today showed normal sinus rhythm and no ST changes. No echocardiogram on file but no loud murmur on physical examination. Patient is at a low risk for any perioperative cardiac complications for a low risk procedure like EGD. No further cardiac testing is indicated prior to proceeding with the planned procedure.  # HTN, controlled -Continue amlodipine 5 mg once daily -Continue metoprolol tartrate 50 mg twice daily -HTN management per PCP  # HLD -Continue atorvastatin 20 mg nightly.  Goal LDL less than 100. -HLD management per PCP  # Screening of CAD -Obtain CT calcium coronaries (can be done after EGD)  I have spent a total of 45 minutes with patient reviewing chart, EKGs, labs and examining patient as well as establishing an assessment and plan that was discussed with the patient.  > 50% of time was spent in direct patient care.    Medication Adjustments/Labs and Tests Ordered: Current medicines  are reviewed at length with the patient today.  Concerns regarding medicines are outlined above.   Tests Ordered: Orders Placed This Encounter  Procedures   CT CARDIAC SCORING (SELF PAY ONLY)   EKG 12-Lead     Medication Changes: No orders of the defined types were placed in this encounter.   Disposition:  Follow up prn  Signed Josecarlos Harriott Fidel Levy, MD, 12/27/2022 9:34 AM    [place] [facility] [place], [place], [place] [address]

## 2022-12-28 ENCOUNTER — Telehealth (INDEPENDENT_AMBULATORY_CARE_PROVIDER_SITE_OTHER): Payer: Self-pay | Admitting: Gastroenterology

## 2022-12-28 NOTE — Telephone Encounter (Signed)
Pt granddaughter Janett Billow contacted and EGD has been scheduled for 01/12/23.  Janett Billow will come by office to pick up instructions. Will contact grand daughter with pre op appt.   PA via [employer]: Approved Authorization [id number]  Tracking [id number] Dates of service 12/28/2022 - 03/30/2023

## 2022-12-28 NOTE — Telephone Encounter (Signed)
Pt pre op scheduled for 01/10/23 at 12:45 at [facility]. Pt granddaughter Janett Billow has been made aware.

## 2022-12-28 NOTE — Telephone Encounter (Signed)
Harvel Quale, MD  [place]all[place]eddi, Quenten Raven, MD; Vicente Males, LPN Thanks Dr. Dellia Cloud. Darcell Sabino, please schedule an EGD for evaluation of chest pain and GERD.  Room 3. Thanks

## 2023-01-09 ENCOUNTER — Telehealth (INDEPENDENT_AMBULATORY_CARE_PROVIDER_SITE_OTHER): Payer: Self-pay | Admitting: Gastroenterology

## 2023-01-09 NOTE — Telephone Encounter (Signed)
Pt grand daughter calling in to reschedule pt EGD due to pt being sick. Pt has been rescheduled to 02/02/23. Updated instructions will be sent via mail.

## 2023-01-10 ENCOUNTER — Encounter (HOSPITAL_COMMUNITY)
Admission: RE | Admit: 2023-01-10 | Discharge: 2023-01-10 | Disposition: A | Discharge status: Home / Self Care | Payer: Medicare PPO | Source: Ambulatory Visit | Attending: Gastroenterology | Admitting: Gastroenterology

## 2023-01-11 ENCOUNTER — Other Ambulatory Visit (HOSPITAL_COMMUNITY): Payer: Medicare PPO

## 2023-01-18 ENCOUNTER — Ambulatory Visit (INDEPENDENT_AMBULATORY_CARE_PROVIDER_SITE_OTHER): Payer: Medicare PPO | Admitting: Gastroenterology

## 2023-01-24 ENCOUNTER — Ambulatory Visit (HOSPITAL_COMMUNITY)
Admission: RE | Admit: 2023-01-24 | Discharge: 2023-01-24 | Disposition: A | Discharge status: Home / Self Care | Payer: Medicare PPO | Source: Ambulatory Visit | Attending: Internal Medicine | Admitting: Internal Medicine

## 2023-01-24 DIAGNOSIS — Z136 Encounter for screening for cardiovascular disorders: Secondary | ICD-10-CM | POA: Insufficient documentation

## 2023-01-31 ENCOUNTER — Encounter (HOSPITAL_COMMUNITY)
Admission: RE | Admit: 2023-01-31 | Discharge: 2023-01-31 | Disposition: A | Discharge status: Home / Self Care | Payer: Medicare PPO | Source: Ambulatory Visit | Attending: Gastroenterology | Admitting: Gastroenterology

## 2023-01-31 ENCOUNTER — Encounter (HOSPITAL_COMMUNITY): Payer: Self-pay | Admitting: Gastroenterology

## 2023-01-31 DIAGNOSIS — H26491 Other secondary cataract, right eye: Secondary | ICD-10-CM | POA: Diagnosis not present

## 2023-02-02 ENCOUNTER — Ambulatory Visit (HOSPITAL_COMMUNITY): Payer: Medicare PPO | Admitting: Certified Registered"

## 2023-02-02 ENCOUNTER — Ambulatory Visit (HOSPITAL_COMMUNITY)
Admission: RE | Admit: 2023-02-02 | Discharge: 2023-02-02 | Disposition: A | Discharge status: Home / Self Care | Payer: Medicare PPO | Attending: Gastroenterology | Admitting: Gastroenterology

## 2023-02-02 ENCOUNTER — Encounter (HOSPITAL_COMMUNITY)
Admission: RE | Disposition: A | Discharge status: Home / Self Care | Payer: Self-pay | Source: Home / Self Care | Attending: Gastroenterology

## 2023-02-02 ENCOUNTER — Ambulatory Visit (HOSPITAL_BASED_OUTPATIENT_CLINIC_OR_DEPARTMENT_OTHER): Payer: Medicare PPO | Admitting: Certified Registered"

## 2023-02-02 DIAGNOSIS — R0789 Other chest pain: Secondary | ICD-10-CM

## 2023-02-02 DIAGNOSIS — K3189 Other diseases of stomach and duodenum: Secondary | ICD-10-CM | POA: Diagnosis not present

## 2023-02-02 DIAGNOSIS — Z7984 Long term (current) use of oral hypoglycemic drugs: Secondary | ICD-10-CM | POA: Diagnosis not present

## 2023-02-02 DIAGNOSIS — I10 Essential (primary) hypertension: Secondary | ICD-10-CM

## 2023-02-02 DIAGNOSIS — E119 Type 2 diabetes mellitus without complications: Secondary | ICD-10-CM

## 2023-02-02 DIAGNOSIS — R1013 Epigastric pain: Secondary | ICD-10-CM

## 2023-02-02 DIAGNOSIS — K219 Gastro-esophageal reflux disease without esophagitis: Secondary | ICD-10-CM | POA: Diagnosis not present

## 2023-02-02 DIAGNOSIS — R1310 Dysphagia, unspecified: Secondary | ICD-10-CM | POA: Insufficient documentation

## 2023-02-02 DIAGNOSIS — Z79899 Other long term (current) drug therapy: Secondary | ICD-10-CM | POA: Insufficient documentation

## 2023-02-02 DIAGNOSIS — E7800 Pure hypercholesterolemia, unspecified: Secondary | ICD-10-CM | POA: Diagnosis not present

## 2023-02-02 HISTORY — PX: ESOPHAGOGASTRODUODENOSCOPY (EGD) WITH PROPOFOL: SHX5813

## 2023-02-02 LAB — GLUCOSE, CAPILLARY: Glucose-Capillary: 113 mg/dL — ABNORMAL HIGH (ref 70–99)

## 2023-02-02 SURGERY — ESOPHAGOGASTRODUODENOSCOPY (EGD) WITH PROPOFOL
Anesthesia: General

## 2023-02-02 MED ORDER — LIDOCAINE HCL (CARDIAC) PF 100 MG/5ML IV SOSY
PREFILLED_SYRINGE | INTRAVENOUS | Status: DC | PRN
  Administered 2023-02-02: 50 mg via INTRAVENOUS

## 2023-02-02 MED ORDER — LACTATED RINGERS IV SOLN: INTRAVENOUS | Status: DC

## 2023-02-02 MED ORDER — PROPOFOL 500 MG/50ML IV EMUL
INTRAVENOUS | Status: DC | PRN
  Administered 2023-02-02: 150 ug/kg/min via INTRAVENOUS

## 2023-02-02 MED ORDER — PROPOFOL 10 MG/ML IV BOLUS
INTRAVENOUS | Status: DC | PRN
  Administered 2023-02-02: 100 mg via INTRAVENOUS

## 2023-02-02 NOTE — Op Note (Signed)
[facility] Patient Name: Leonard Flores Procedure Date: 02/02/2023 11:04 AM MRN: [id number] Date of Birth: 1941-02-21 Attending MD: Katrinka Blazing , , [id number] CSN: [id number] Age: 82 Admit Type: Outpatient Procedure:                Upper GI endoscopy Indications:              Dysphagia, Chest pain (non cardiac) Providers:                Katrinka Blazing, Nena Polio, RN, Lennice Sites                            Technician, Technician Referring MD:              Medicines:                Monitored Anesthesia Care Complications:            No immediate complications. Estimated Blood Loss:     Estimated blood loss: none. Procedure:                Pre-Anesthesia Assessment:                           - Prior to the procedure, a History and Physical                            was performed, and patient medications, allergies                            and sensitivities were reviewed. The patient's                            tolerance of previous anesthesia was reviewed.                           - The risks and benefits of the procedure and the                            sedation options and risks were discussed with the                            patient. All questions were answered and informed                            consent was obtained.                           - ASA Grade Assessment: II - A patient with mild                            systemic disease.                           After obtaining informed consent, the endoscope was                            passed under direct vision. Throughout the  procedure, the patient's blood pressure, pulse, and                            oxygen saturations were monitored continuously. The                            [id number]-[id number] ([id number]) scope was introduced through the                            mouth, and advanced to the second part of duodenum.                            The upper GI endoscopy was accomplished  without                            difficulty. The patient tolerated the procedure                            well. Scope In: 11:22:54 AM Scope Out: 11:27:13 AM Total Procedure Duration: 0 hours 4 minutes 19 seconds  Findings:      The examined esophagus was normal.      A deformity was found in the gastric body. It appeared as if the fundus       and part of the gastric body had herniated and created an acute       angulation of the stomach. This was reduced with the scope.      The examined duodenum was normal. Impression:               - Normal esophagus.                           - Deformity in the gastric fundus and body.                            Possible paraesophageal hernia                           - Normal examined duodenum.                           - No specimens collected. Moderate Sedation:      Per Anesthesia Care Recommendation:           - Discharge patient to home (ambulatory).                           - Resume previous diet.                           - Continue present medications.                           - Proceed with barium esophagram. Procedure Code(s):        --- Professional ---                           [phone], Esophagogastroduodenoscopy, flexible,  transoral; diagnostic, including collection of                            specimen(s) by brushing or washing, when performed                            (separate procedure) Diagnosis Code(s):        --- Professional ---                           K31.89, Other diseases of stomach and duodenum                           R13.10, Dysphagia, unspecified                           R07.89, Other chest pain CPT copyright 2022 [employer] Association. All rights reserved. The codes documented in this report are preliminary and upon coder review may  be revised to meet current compliance requirements. Katrinka Blazing, MD Katrinka Blazing,  02/02/2023 11:37:52 AM This report has been  signed electronically. Number of Addenda: 0

## 2023-02-02 NOTE — Anesthesia Preprocedure Evaluation (Signed)
Anesthesia Evaluation  Patient identified by MRN, date of birth, ID band Patient awake    Reviewed: Allergy & Precautions, H&P , NPO status , Patient's Chart, lab work & pertinent test results, reviewed documented beta blocker date and time   Airway Mallampati: II  TM Distance: >3 FB Neck ROM: full    Dental no notable dental hx.    Pulmonary neg pulmonary ROS   Pulmonary exam normal breath sounds clear to auscultation       Cardiovascular Exercise Tolerance: Good hypertension, negative cardio ROS  Rhythm:regular Rate:Normal     Neuro/Psych negative neurological ROS  negative psych ROS   GI/Hepatic negative GI ROS, Neg liver ROS,GERD  ,,  Endo/Other  negative endocrine ROSdiabetes    Renal/GU negative Renal ROS  negative genitourinary   Musculoskeletal   Abdominal   Peds  Hematology negative hematology ROS (+)   Anesthesia Other Findings   Reproductive/Obstetrics negative OB ROS                             Anesthesia Physical Anesthesia Plan  ASA: 2  Anesthesia Plan: General   Post-op Pain Management:    Induction:   PONV Risk Score and Plan: Propofol infusion  Airway Management Planned:   Additional Equipment:   Intra-op Plan:   Post-operative Plan:   Informed Consent: I have reviewed the patients History and Physical, chart, labs and discussed the procedure including the risks, benefits and alternatives for the proposed anesthesia with the patient or authorized representative who has indicated his/her understanding and acceptance.     Dental Advisory Given  Plan Discussed with: CRNA  Anesthesia Plan Comments:        Anesthesia Quick Evaluation  

## 2023-02-02 NOTE — Discharge Instructions (Signed)
You are being discharged to home.  Resume your previous diet.  Proceed with barium esophagram. Continue your present medications.

## 2023-02-02 NOTE — Anesthesia Procedure Notes (Signed)
Date/Time: 02/02/2023 11:15 AM  Performed by: Julian Reil, CRNAPre-anesthesia Checklist: Patient identified, Emergency Drugs available, Suction available and Patient being monitored Oxygen Delivery Method: Nasal cannula Placement Confirmation: positive ETCO2

## 2023-02-02 NOTE — Transfer of Care (Signed)
Immediate Anesthesia Transfer of Care Note  Patient: Leonard Flores  Procedure(s) Performed: ESOPHAGOGASTRODUODENOSCOPY (EGD) WITH PROPOFOL  Patient Location: Short Stay  Anesthesia Type:General  Level of Consciousness: drowsy  Airway & Oxygen Therapy: Patient connected to nasal cannula oxygen  Post-op Assessment: Report given to RN and Post -op Vital signs reviewed and stable  Post vital signs: Reviewed and stable  Last Vitals:  Vitals Value Taken Time  BP    Temp    Pulse    Resp    SpO2      Last Pain:  Vitals:   02/02/23 1117  TempSrc:   PainSc: 3       Patients Stated Pain Goal: 7 (02/02/23 1117)  Complications: No notable events documented.

## 2023-02-02 NOTE — H&P (Signed)
Leonard Flores is an 82 y.o. male.   Chief Complaint: dysphagia and chest tightness HPI: Leonard Flores is a 82 y.o. male with past medical history of diabetes, hyperlipidemia and hypertension, who presents for evaluation of dysphagia and chest pressure.   Reports that he has felt omeprazole has helped with his chest discomfort and this is less frequent when he swallows.  No episodes of dysphagia recently.  Past Medical History:  Diagnosis Date   Diabetes mellitus without complication (HCC)    Elevated cholesterol    Hypertension     Past Surgical History:  Procedure Laterality Date   BACK SURGERY     TUMOR REMOVAL      Family History  Problem Relation Age of Onset   Diabetes Mother    Aneurysm Mother    Social History:  reports that he has never smoked. He has never used smokeless tobacco. He reports that he does not drink alcohol and does not use drugs.  Allergies:  Allergies  Allergen Reactions   Penicillins Itching    Did it involve swelling of the face/tongue/throat, SOB, or low BP? No Did it involve sudden or severe rash/hives, skin peeling, or any reaction on the inside of your mouth or nose? Yes Did you need to seek medical attention at a hospital or doctor's office? No When did it last happen?   Over 10 years ago If all above answers are "NO", may proceed with cephalosporin use.    Medications Prior to Admission  Medication Sig Dispense Refill   amLODipine (NORVASC) 5 MG tablet Take 5 mg by mouth every evening.     atorvastatin (LIPITOR) 20 MG tablet Take 20 mg by mouth in the morning.     metFORMIN (GLUCOPHAGE) 500 MG tablet Take 500 mg by mouth 2 (two) times daily.     metoprolol (LOPRESSOR) 50 MG tablet Take 50 mg by mouth 2 (two) times daily.     omeprazole (PRILOSEC) 40 MG capsule Take 40 mg by mouth in the morning.     tamsulosin (FLOMAX) 0.4 MG CAPS capsule Take 0.4 mg by mouth daily as needed (difficulty with urination.).      Results for orders  placed or performed during the hospital encounter of 02/02/23 (from the past 48 hour(s))  Glucose, capillary     Status: Abnormal   Collection Time: 02/02/23  9:48 AM  Result Value Ref Range   Glucose-Capillary 113 (H) 70 - 99 mg/dL    Comment: Glucose reference range applies only to samples taken after fasting for at least 8 hours.   No results found.  Review of Systems  HENT:  Positive for trouble swallowing.   Respiratory:  Positive for chest tightness.   All other systems reviewed and are negative.   Blood pressure (!) 146/77, pulse 60, temperature 97.7 F (36.5 C), temperature source Oral, resp. rate 18, SpO2 99 %. Physical Exam  GENERAL: The patient is AO x3, in no acute distress. HEENT: Head is normocephalic and atraumatic. EOMI are intact. Mouth is well hydrated and without lesions. NECK: Supple. No masses LUNGS: Clear to auscultation. No presence of rhonchi/wheezing/rales. Adequate chest expansion HEART: RRR, normal s1 and s2. ABDOMEN: Soft, nontender, no guarding, no peritoneal signs, and nondistended. BS +. No masses. EXTREMITIES: Without any cyanosis, clubbing, rash, lesions or edema. NEUROLOGIC: AOx3, no focal motor deficit. SKIN: no jaundice, no rashes  Assessment/Plan Leonard Flores is a 82 y.o. male with past medical history of diabetes, hyperlipidemia and hypertension, who  presents for evaluation of dysphagia and chest pressure.  Will proceed with EGD.  Dolores Frame, MD 02/02/2023, 10:07 AM

## 2023-02-04 NOTE — Anesthesia Postprocedure Evaluation (Signed)
Anesthesia Post Note  Patient: Leonard Flores  Procedure(s) Performed: ESOPHAGOGASTRODUODENOSCOPY (EGD) WITH PROPOFOL  Patient location during evaluation: Phase II Anesthesia Type: General Level of consciousness: awake Pain management: pain level controlled Vital Signs Assessment: post-procedure vital signs reviewed and stable Respiratory status: spontaneous breathing and respiratory function stable Cardiovascular status: blood pressure returned to baseline and stable Postop Assessment: no headache and no apparent nausea or vomiting Anesthetic complications: no Comments: Late entry   No notable events documented.   Last Vitals:  Vitals:   02/02/23 1132 02/02/23 1139  BP: (!) 98/44 (!) 104/53  Pulse: 60   Resp: 20   Temp: (!) 36.4 C   SpO2: 97%     Last Pain:  Vitals:   02/02/23 1139  TempSrc:   PainSc: 0-No pain                 Windell Norfolk

## 2023-02-06 ENCOUNTER — Telehealth (INDEPENDENT_AMBULATORY_CARE_PROVIDER_SITE_OTHER): Payer: Self-pay | Admitting: Gastroenterology

## 2023-02-06 DIAGNOSIS — R079 Chest pain, unspecified: Secondary | ICD-10-CM

## 2023-02-06 NOTE — Telephone Encounter (Signed)
Dolores Frame, MD  Marlowe Shores, LPN Hi Kenney Houseman,  Can you please schedule a barium esophagram? Dx: chest pain rule out paraesophageal hernia.  Thanks,  Katrinka Blazing, MD Gastroenterology and Hepatology [facility] Gastroenterology  Esophagram scheduled for 02/12/23 at 10:00am at [facility]. Pt is to arrive at 9:45am. NPO 3 hours prior. Contacted pt and gave appt. Pt states that grand daughter Shanda Bumps handles all his appts. Contacted Shanda Bumps and gave her appt info. Shanda Bumps states pt may call back and cancel or he may get her to call to cancel because pt said if its just a hernia causing all the trouble he was not going to keep having test done.

## 2023-02-07 DIAGNOSIS — E1169 Type 2 diabetes mellitus with other specified complication: Secondary | ICD-10-CM | POA: Diagnosis not present

## 2023-02-09 ENCOUNTER — Encounter (HOSPITAL_COMMUNITY): Payer: Self-pay | Admitting: Gastroenterology

## 2023-02-12 ENCOUNTER — Ambulatory Visit (HOSPITAL_COMMUNITY)
Admission: RE | Admit: 2023-02-12 | Discharge: 2023-02-12 | Disposition: A | Discharge status: Home / Self Care | Payer: Medicare PPO | Source: Ambulatory Visit | Attending: Gastroenterology | Admitting: Gastroenterology

## 2023-02-12 DIAGNOSIS — R079 Chest pain, unspecified: Secondary | ICD-10-CM | POA: Insufficient documentation

## 2023-02-12 DIAGNOSIS — K224 Dyskinesia of esophagus: Secondary | ICD-10-CM | POA: Diagnosis not present

## 2023-02-12 DIAGNOSIS — R1013 Epigastric pain: Secondary | ICD-10-CM | POA: Diagnosis not present

## 2023-02-13 ENCOUNTER — Other Ambulatory Visit (INDEPENDENT_AMBULATORY_CARE_PROVIDER_SITE_OTHER): Payer: Self-pay | Admitting: *Deleted

## 2023-02-13 DIAGNOSIS — E1169 Type 2 diabetes mellitus with other specified complication: Secondary | ICD-10-CM | POA: Diagnosis not present

## 2023-02-13 DIAGNOSIS — N1831 Chronic kidney disease, stage 3a: Secondary | ICD-10-CM | POA: Diagnosis not present

## 2023-02-13 DIAGNOSIS — E7849 Other hyperlipidemia: Secondary | ICD-10-CM | POA: Diagnosis not present

## 2023-02-13 DIAGNOSIS — I10 Essential (primary) hypertension: Secondary | ICD-10-CM | POA: Diagnosis not present

## 2023-02-13 DIAGNOSIS — K449 Diaphragmatic hernia without obstruction or gangrene: Secondary | ICD-10-CM | POA: Diagnosis not present

## 2023-02-13 DIAGNOSIS — I700 Atherosclerosis of aorta: Secondary | ICD-10-CM | POA: Diagnosis not present

## 2023-02-13 DIAGNOSIS — K219 Gastro-esophageal reflux disease without esophagitis: Secondary | ICD-10-CM | POA: Diagnosis not present

## 2023-02-13 DIAGNOSIS — M5030 Other cervical disc degeneration, unspecified cervical region: Secondary | ICD-10-CM | POA: Diagnosis not present

## 2023-02-13 MED ORDER — OMEPRAZOLE 40 MG PO CPDR: DELAYED_RELEASE_CAPSULE | ORAL | 5 refills | Status: DC

## 2023-02-13 MED ORDER — OMEPRAZOLE 40 MG PO CPDR: DELAYED_RELEASE_CAPSULE | ORAL | 1 refills | Status: DC

## 2023-05-10 ENCOUNTER — Other Ambulatory Visit: Payer: Self-pay

## 2023-05-10 ENCOUNTER — Encounter (HOSPITAL_COMMUNITY): Payer: Self-pay | Admitting: Emergency Medicine

## 2023-05-10 ENCOUNTER — Emergency Department (HOSPITAL_COMMUNITY)
Admission: EM | Admit: 2023-05-10 | Discharge: 2023-05-10 | Disposition: A | Discharge status: Home / Self Care | Payer: Medicare PPO | Attending: Emergency Medicine | Admitting: Emergency Medicine

## 2023-05-10 ENCOUNTER — Emergency Department (HOSPITAL_COMMUNITY): Payer: Medicare PPO

## 2023-05-10 DIAGNOSIS — R1030 Lower abdominal pain, unspecified: Secondary | ICD-10-CM | POA: Diagnosis present

## 2023-05-10 DIAGNOSIS — K4090 Unilateral inguinal hernia, without obstruction or gangrene, not specified as recurrent: Secondary | ICD-10-CM | POA: Diagnosis not present

## 2023-05-10 DIAGNOSIS — N50811 Right testicular pain: Secondary | ICD-10-CM | POA: Diagnosis not present

## 2023-05-10 LAB — URINALYSIS, W/ REFLEX TO CULTURE (INFECTION SUSPECTED)
Bacteria, UA: NONE SEEN
Bilirubin Urine: NEGATIVE
Glucose, UA: NEGATIVE mg/dL
Ketones, ur: NEGATIVE mg/dL
Leukocytes,Ua: NEGATIVE
Nitrite: NEGATIVE
Protein, ur: NEGATIVE mg/dL
Specific Gravity, Urine: 1.005 (ref 1.005–1.030)
pH: 6 (ref 5.0–8.0)

## 2023-05-10 NOTE — Discharge Instructions (Signed)
You have a hernia in your right groin (an inguinal hernia).  Be sure to lessen pressure in your abdomen including having smoother bowel movements.  Drink more water, take over-the-counter MiraLAX, as well as increase your fiber intake (both with diet and supplements).  If you develop new or worsening pain, vomiting, the hernia comes out [facility] backend, or any other new/concerning symptoms then return to the ER or call 911.

## 2023-05-10 NOTE — ED Triage Notes (Signed)
Pt c/o right groin pain and swelling. Pt reports at times he can feel and see a lump. Pt reports treatment for frequent urination. Denies N/V/D.

## 2023-05-10 NOTE — ED Provider Notes (Signed)
[place] EMERGENCY DEPARTMENT AT [facility] Provider Note   CSN: [id number] Arrival date & time: 05/10/23  0944     History  Chief Complaint  Patient presents with   Urinary Frequency   Groin Pain    Right side    Leonard Flores is a 82 y.o. male.  HPI 82 year old male presents with right groin pain.  First occurred a week ago.  He felt like there was a lump that was not in his testicle but was painful.  Eventually it resolved on its own.  Felt a similar type pain this morning prior to going to the doctor's office.  It also has resolved.  At the time he had some discomfort in his right testicle. Right now the area feels sore but is much better.  No vomiting, abdominal pain.  He denies any dysuria but has been having chronic urinary frequency at night that he assumes is due to his prostate.  Home Medications Prior to Admission medications   Medication Sig Start Date End Date Taking? Authorizing Provider  amLODipine (NORVASC) 5 MG tablet Take 5 mg by mouth every evening. 12/04/22   [provider]  atorvastatin (LIPITOR) 20 MG tablet Take 20 mg by mouth in the morning. 10/01/13   [provider]  metFORMIN (GLUCOPHAGE) 500 MG tablet Take 500 mg by mouth 2 (two) times daily. 09/05/19   [provider]  metoprolol (LOPRESSOR) 50 MG tablet Take 50 mg by mouth 2 (two) times daily.    [provider]  omeprazole (PRILOSEC) 40 MG capsule Take one twice daily 02/13/23   Marguerita Merles, Reuel Boom, MD  tamsulosin (FLOMAX) 0.4 MG CAPS capsule Take 0.4 mg by mouth daily as needed (difficulty with urination.). 08/27/19   [provider]      Allergies    Penicillins    Review of Systems   Review of Systems  Gastrointestinal:  Negative for abdominal pain.  Genitourinary:  Positive for frequency (at night). Negative for dysuria, scrotal swelling and testicular pain.    Physical Exam Updated Vital Signs BP 127/76   Pulse 63   Temp  97.7 F (36.5 C)   Resp 18   Ht 6' (1.829 m)   Wt 86.2 kg   SpO2 96%   BMI 25.77 kg/m  Physical Exam Vitals and nursing note reviewed.  Constitutional:      Appearance: He is well-developed.  HENT:     Head: Normocephalic and atraumatic.  Pulmonary:     Effort: Pulmonary effort is normal.  Abdominal:     General: There is no distension.     Palpations: Abdomen is soft.     Tenderness: There is no abdominal tenderness.     Hernia: There is no hernia in the right inguinal area (no incarcerated hernia).  Genitourinary:    Penis: No tenderness or swelling.      Testes:        Right: Tenderness or swelling not present.        Left: Tenderness or swelling not present.    Skin:    General: Skin is warm and dry.  Neurological:     Mental Status: He is alert.     ED Results / Procedures / Treatments   Labs (all labs ordered are listed, but only abnormal results are displayed) Labs Reviewed  URINALYSIS, W/ REFLEX TO CULTURE (INFECTION SUSPECTED) - Abnormal; Notable for the following components:      Result Value   Color, Urine STRAW (*)  Hgb urine dipstick SMALL (*)    All other components within normal limits    EKG None  Radiology US SCROTUM W/DOPPLER  Result Date: 05/10/2023 CLINICAL DATA:  Right testicular pain for several months. EXAM: SCROTAL ULTRASOUND DOPPLER ULTRASOUND OF THE TESTICLES TECHNIQUE: Complete ultrasound examination of the testicles, epididymis, and other scrotal structures was performed. Color and spectral Doppler ultrasound were also utilized to evaluate blood flow to the testicles. COMPARISON:  None Available. FINDINGS: Right testicle Measurements: 4.0 x 3.1 x 2.1 cm. No mass or microlithiasis visualized. Left testicle Measurements: 4.3 x 2.7 x 2.1 cm. No mass or microlithiasis visualized. Right epididymis:  Normal in size and appearance. Left epididymis:  Normal in size and appearance. Hydrocele:  None visualized. Varicocele:  None visualized.  Pulsed Doppler interrogation of both testes demonstrates normal low resistance arterial and venous waveforms bilaterally. Probable fat containing right inguinal hernia is noted. IMPRESSION: No evidence of testicular mass or torsion. Probable fat containing right inguinal hernia. Electronically Signed   By: Lupita Raider M.D.   On: 05/10/2023 12:01    Procedures Procedures    Medications Ordered in ED Medications - No data to display  ED Course/ Medical Decision Making/ A&P                                 Medical Decision Making Amount and/or Complexity of Data Reviewed Labs:     Details: Mild hematuria Radiology: ordered and independent interpretation performed.    Details: No testicular torsion   Presentation is consistent with a right inguinal hernia.  Currently not incarcerated.  Essentially asymptomatic besides a little bit of soreness.  I discussed that this is the most likely cause and we discussed bowel care and need for outpatient general surgical follow-up.  No evidence of torsion.  Doubt that he is having ureteral colic as a cause of his symptoms.  No indication for CT.  Will discharge home with return precautions.        Final Clinical Impression(s) / ED Diagnoses Final diagnoses:  Right inguinal hernia    Rx / DC Orders ED Discharge Orders     None         Pricilla Loveless, MD 05/10/23 1413

## 2023-05-28 ENCOUNTER — Ambulatory Visit (INDEPENDENT_AMBULATORY_CARE_PROVIDER_SITE_OTHER): Payer: Medicare PPO | Admitting: Gastroenterology

## 2023-06-07 ENCOUNTER — Ambulatory Visit: Payer: Medicare PPO | Admitting: General Surgery

## 2023-06-07 ENCOUNTER — Encounter: Payer: Self-pay | Admitting: General Surgery

## 2023-06-07 VITALS — BP 144/75 | HR 59 | Temp 97.8°F | Resp 14 | Ht 72.0 in | Wt 188.0 lb

## 2023-06-07 DIAGNOSIS — K4090 Unilateral inguinal hernia, without obstruction or gangrene, not specified as recurrent: Secondary | ICD-10-CM

## 2023-06-07 NOTE — Progress Notes (Signed)
Leonard Flores; [id number]; 02/03/1941   HPI Patient is an 82 year old white male who was referred to my care by Dr. Leandrew Koyanagi in the emergency room for evaluation and treatment of a right inguinal hernia.  He had an episode of incarceration of the hernia which was reduced in the emergency room at [facility].  He states that he first noticed the hernia in early August.  The hernia pops in and out and is made worse with straining.  It does cause him discomfort.  He denies any fever or chills.  He denies any vomiting. Past Medical History:  Diagnosis Date   Diabetes mellitus without complication (HCC)    Elevated cholesterol    Hypertension     Past Surgical History:  Procedure Laterality Date   BACK SURGERY     ESOPHAGOGASTRODUODENOSCOPY (EGD) WITH PROPOFOL N/A 02/02/2023   Procedure: ESOPHAGOGASTRODUODENOSCOPY (EGD) WITH PROPOFOL;  Surgeon: Dolores Frame, MD;  Location: [facility];  Service: Gastroenterology;  Laterality: N/A;  1:30pm;ASA 3   TUMOR REMOVAL      Family History  Problem Relation Age of Onset   Diabetes Mother    Aneurysm Mother     Current Outpatient Medications on File Prior to Visit  Medication Sig Dispense Refill   amLODipine (NORVASC) 5 MG tablet Take 5 mg by mouth every evening.     atorvastatin (LIPITOR) 20 MG tablet Take 20 mg by mouth in the morning.     metFORMIN (GLUCOPHAGE) 500 MG tablet Take 500 mg by mouth 2 (two) times daily.     metoprolol (LOPRESSOR) 50 MG tablet Take 50 mg by mouth 2 (two) times daily.     mirabegron ER (MYRBETRIQ) 25 MG TB24 tablet Take 25 mg by mouth daily.     omeprazole (PRILOSEC) 40 MG capsule Take one twice daily 180 capsule 1   No current facility-administered medications on file prior to visit.    Allergies  Allergen Reactions   Penicillins Itching    Did it involve swelling of the face/tongue/throat, SOB, or low BP? No Did it involve sudden or severe rash/hives, skin peeling, or any reaction on the inside  of your mouth or nose? Yes Did you need to seek medical attention at a hospital or doctor's office? No When did it last happen?   Over 10 years ago If all above answers are "NO", may proceed with cephalosporin use.    Social History   Substance and Sexual Activity  Alcohol Use No    Social History   Tobacco Use  Smoking Status Never  Smokeless Tobacco Never    Review of Systems  Constitutional: Negative.   HENT: Negative.    Eyes: Negative.   Respiratory: Negative.    Cardiovascular: Negative.   Gastrointestinal:  Positive for abdominal pain, heartburn and nausea.  Genitourinary:  Positive for frequency.  Musculoskeletal: Negative.   Skin: Negative.   Neurological: Negative.   Endo/Heme/Allergies: Negative.   Psychiatric/Behavioral: Negative.      Objective   Vitals:   06/07/23 1112  BP: (!) 144/75  Pulse: (!) 59  Resp: 14  Temp: 97.8 F (36.6 C)  SpO2: 95%    Physical Exam Vitals reviewed.  Constitutional:      Appearance: Normal appearance. He is normal weight. He is not ill-appearing.  HENT:     Head: Normocephalic.  Cardiovascular:     Rate and Rhythm: Normal rate and regular rhythm.     Heart sounds: Normal heart sounds. No murmur heard.  No friction rub. No gallop.  Pulmonary:     Effort: Pulmonary effort is normal. No respiratory distress.     Breath sounds: Normal breath sounds. No stridor. No wheezing, rhonchi or rales.  Abdominal:     General: Bowel sounds are normal. There is no distension.     Palpations: Abdomen is soft. There is no mass.     Tenderness: There is no abdominal tenderness. There is no guarding or rebound.     Hernia: A hernia is present.     Comments: Easily reducible right inguinal hernia.  No left inguinal hernia appreciated.  Genitourinary:    Testes: Normal.  Skin:    General: Skin is warm and dry.  Neurological:     Mental Status: He is alert and oriented to person, place, and time.   ER notes  reviewed.  Assessment  Right inguinal hernia, symptomatic Plan  Patient is scheduled for robotic assisted laparoscopic right inguinal herniorrhaphy with mesh on 07/09/2023.  The risks and benefits of the procedure including bleeding, infection, mesh use, and the possibility of recurrence of the hernia were fully explained to the patient, who gave informed consent.  He was given instructions on how to treat an episode of incarceration.

## 2023-06-08 NOTE — Addendum Note (Signed)
Addended by: Phillips Odor on: 06/08/2023 11:19 AM   Modules accepted: Orders

## 2023-06-14 NOTE — H&P (Signed)
Leonard Flores; [id number]; 02/03/1941   HPI Patient is an 82 year old white male who was referred to my care by Dr. Leandrew Koyanagi in the emergency room for evaluation and treatment of a right inguinal hernia.  He had an episode of incarceration of the hernia which was reduced in the emergency room at [facility].  He states that he first noticed the hernia in early August.  The hernia pops in and out and is made worse with straining.  It does cause him discomfort.  He denies any fever or chills.  He denies any vomiting. Past Medical History:  Diagnosis Date   Diabetes mellitus without complication (HCC)    Elevated cholesterol    Hypertension     Past Surgical History:  Procedure Laterality Date   BACK SURGERY     ESOPHAGOGASTRODUODENOSCOPY (EGD) WITH PROPOFOL N/A 02/02/2023   Procedure: ESOPHAGOGASTRODUODENOSCOPY (EGD) WITH PROPOFOL;  Surgeon: Dolores Frame, MD;  Location: [facility];  Service: Gastroenterology;  Laterality: N/A;  1:30pm;ASA 3   TUMOR REMOVAL      Family History  Problem Relation Age of Onset   Diabetes Mother    Aneurysm Mother     Current Outpatient Medications on File Prior to Visit  Medication Sig Dispense Refill   amLODipine (NORVASC) 5 MG tablet Take 5 mg by mouth every evening.     atorvastatin (LIPITOR) 20 MG tablet Take 20 mg by mouth in the morning.     metFORMIN (GLUCOPHAGE) 500 MG tablet Take 500 mg by mouth 2 (two) times daily.     metoprolol (LOPRESSOR) 50 MG tablet Take 50 mg by mouth 2 (two) times daily.     mirabegron ER (MYRBETRIQ) 25 MG TB24 tablet Take 25 mg by mouth daily.     omeprazole (PRILOSEC) 40 MG capsule Take one twice daily 180 capsule 1   No current facility-administered medications on file prior to visit.    Allergies  Allergen Reactions   Penicillins Itching    Did it involve swelling of the face/tongue/throat, SOB, or low BP? No Did it involve sudden or severe rash/hives, skin peeling, or any reaction on the inside  of your mouth or nose? Yes Did you need to seek medical attention at a hospital or doctor's office? No When did it last happen?   Over 10 years ago If all above answers are "NO", may proceed with cephalosporin use.    Social History   Substance and Sexual Activity  Alcohol Use No    Social History   Tobacco Use  Smoking Status Never  Smokeless Tobacco Never    Review of Systems  Constitutional: Negative.   HENT: Negative.    Eyes: Negative.   Respiratory: Negative.    Cardiovascular: Negative.   Gastrointestinal:  Positive for abdominal pain, heartburn and nausea.  Genitourinary:  Positive for frequency.  Musculoskeletal: Negative.   Skin: Negative.   Neurological: Negative.   Endo/Heme/Allergies: Negative.   Psychiatric/Behavioral: Negative.      Objective   Vitals:   06/07/23 1112  BP: (!) 144/75  Pulse: (!) 59  Resp: 14  Temp: 97.8 F (36.6 C)  SpO2: 95%    Physical Exam Vitals reviewed.  Constitutional:      Appearance: Normal appearance. He is normal weight. He is not ill-appearing.  HENT:     Head: Normocephalic.  Cardiovascular:     Rate and Rhythm: Normal rate and regular rhythm.     Heart sounds: Normal heart sounds. No murmur heard.  No friction rub. No gallop.  Pulmonary:     Effort: Pulmonary effort is normal. No respiratory distress.     Breath sounds: Normal breath sounds. No stridor. No wheezing, rhonchi or rales.  Abdominal:     General: Bowel sounds are normal. There is no distension.     Palpations: Abdomen is soft. There is no mass.     Tenderness: There is no abdominal tenderness. There is no guarding or rebound.     Hernia: A hernia is present.     Comments: Easily reducible right inguinal hernia.  No left inguinal hernia appreciated.  Genitourinary:    Testes: Normal.  Skin:    General: Skin is warm and dry.  Neurological:     Mental Status: He is alert and oriented to person, place, and time.   ER notes  reviewed.  Assessment  Right inguinal hernia, symptomatic Plan  Patient is scheduled for robotic assisted laparoscopic right inguinal herniorrhaphy with mesh on 07/09/2023.  The risks and benefits of the procedure including bleeding, infection, mesh use, and the possibility of recurrence of the hernia were fully explained to the patient, who gave informed consent.  He was given instructions on how to treat an episode of incarceration.

## 2023-06-19 DIAGNOSIS — I10 Essential (primary) hypertension: Secondary | ICD-10-CM | POA: Diagnosis not present

## 2023-06-19 DIAGNOSIS — Z1329 Encounter for screening for other suspected endocrine disorder: Secondary | ICD-10-CM | POA: Diagnosis not present

## 2023-06-19 DIAGNOSIS — K219 Gastro-esophageal reflux disease without esophagitis: Secondary | ICD-10-CM | POA: Diagnosis not present

## 2023-06-19 DIAGNOSIS — N1831 Chronic kidney disease, stage 3a: Secondary | ICD-10-CM | POA: Diagnosis not present

## 2023-06-19 DIAGNOSIS — E1169 Type 2 diabetes mellitus with other specified complication: Secondary | ICD-10-CM | POA: Diagnosis not present

## 2023-06-19 DIAGNOSIS — E7849 Other hyperlipidemia: Secondary | ICD-10-CM | POA: Diagnosis not present

## 2023-06-29 DIAGNOSIS — I700 Atherosclerosis of aorta: Secondary | ICD-10-CM | POA: Diagnosis not present

## 2023-06-29 DIAGNOSIS — E7849 Other hyperlipidemia: Secondary | ICD-10-CM | POA: Diagnosis not present

## 2023-06-29 DIAGNOSIS — E1169 Type 2 diabetes mellitus with other specified complication: Secondary | ICD-10-CM | POA: Diagnosis not present

## 2023-06-29 DIAGNOSIS — K4090 Unilateral inguinal hernia, without obstruction or gangrene, not specified as recurrent: Secondary | ICD-10-CM | POA: Diagnosis not present

## 2023-06-29 DIAGNOSIS — Z0001 Encounter for general adult medical examination with abnormal findings: Secondary | ICD-10-CM | POA: Diagnosis not present

## 2023-06-29 DIAGNOSIS — Z6826 Body mass index (BMI) 26.0-26.9, adult: Secondary | ICD-10-CM | POA: Diagnosis not present

## 2023-06-29 DIAGNOSIS — N1831 Chronic kidney disease, stage 3a: Secondary | ICD-10-CM | POA: Diagnosis not present

## 2023-06-29 DIAGNOSIS — K219 Gastro-esophageal reflux disease without esophagitis: Secondary | ICD-10-CM | POA: Diagnosis not present

## 2023-06-29 DIAGNOSIS — I10 Essential (primary) hypertension: Secondary | ICD-10-CM | POA: Diagnosis not present

## 2023-07-03 NOTE — Patient Instructions (Signed)
Leonard Flores  07/03/2023     @PREFPERIOPPHARMACY @   Your procedure is scheduled on 07/09/2023.  Report to [facility] at 10:40 A.M.  Call this number if you have problems the morning of surgery:  [phone]  If you experience any cold or flu symptoms such as cough, fever, chills, shortness of breath, etc. between now and your scheduled surgery, please notify us at the above number.   Remember:   Do not eat or drink after midnight.   Take these medicines the morning of surgery with A SIP OF WATER : Amlodipine Metoprolol and Omeprazole   Do not take any diabetic medications the am of the procedure.    Do not wear jewelry, make-up or nail polish, including gel polish,  artificial nails, or any other type of covering on natural nails (fingers and  toes).  Do not wear lotions, powders, or perfumes, or deodorant.  Do not shave 48 hours prior to surgery.  Men may shave face and neck.  Do not bring valuables to the hospital.  [facility] is not responsible for any belongings or valuables.  Contacts, dentures or bridgework may not be worn into surgery.  Leave your suitcase in the car.  After surgery it may be brought to your room.  For patients admitted to the hospital, discharge time will be determined by your treatment team.  Patients discharged the day of surgery will not be allowed to drive home.   Name and phone number of your driver:   Family Special instructions:  N/A  Please read over the following fact sheets that you were given. Care and Recovery After Surgery   Laparoscopic Ventral Hernia Repair Laparoscopic ventral hernia repairis a procedure to fix a bulge of tissue that pushes through a weak area of muscle in the abdomen (ventral hernia). A ventral hernia may be: Above the belly button. This is called an epigastric hernia. At the belly button. This is called an umbilical hernia. At the incision site from previous abdominal surgery. This is called an  incisional hernia. You may have this procedure as emergency surgery if part of your intestine gets trapped inside the hernia and starts to lose its blood supply (strangulation). Laparoscopic surgery is done through small incisions using a thin surgical telescope with a light and camera (laparoscope). During surgery, your surgeon will use images from the laparoscope to guide the procedure. A mesh screen will be placed in the hernia to close the opening and strengthen the abdominal wall. Tell a health care provider about: Any allergies you have. All medicines you are taking, including vitamins, herbs, eye drops, creams, and over-the-counter medicines. Any problems you or family members have had with anesthetic medicines. Any blood disorders you have. Any surgeries you have had. Any medical conditions you have. Whether you are pregnant or may be pregnant. What are the risks? Generally, this is a safe procedure. However, problems may occur, including: Infection. Bleeding. Damage to nearby structures or organs in the abdomen. Trouble urinating or having a bowel movement after surgery. Blood clots. The hernia coming back after surgery. Fluid buildup in the area of the hernia. In some cases, your health care provider may need to switch from a laparoscopic procedure to a procedure that is done through a single, larger incision in the abdomen (open procedure). You may need an open procedure if: You have a hernia that is difficult to repair. Your organs are hard to see with the laparoscope. You have bleeding problems during  the laparoscopic procedure. What happens before the procedure? Staying hydrated Follow instructions from your health care provider about hydration, which may include: Up to 2 hours before the procedure - you may continue to drink clear liquids, such as water, clear fruit juice, black coffee, and plain tea.  Eating and drinking restrictions Follow instructions from your health  care provider about eating and drinking, which may include: 8 hours before the procedure - stop eating heavy meals or foods, such as meat, fried foods, or fatty foods. 6 hours before the procedure - stop eating light meals or foods, such as toast or cereal. 6 hours before the procedure - stop drinking milk or drinks that contain milk. 2 hours before the procedure - stop drinking clear liquids. Medicines Ask your health care provider about: Changing or stopping your regular medicines. This is especially important if you are taking diabetes medicines or blood thinners. Taking medicines such as aspirin and ibuprofen. These medicines can thin your blood. Do not take these medicines unless your health care provider tells you to take them. Taking over-the-counter medicines, vitamins, herbs, and supplements. Tests You may need to have tests before the procedure, such as: Blood tests. Urine tests. Abdominal ultrasound. Chest X-ray. Electrocardiogram (ECG). General instructions You may be asked to take a laxative or do an enema to empty your bowel before surgery (bowel prep). Do not use any products that contain nicotine or tobacco for at least 4 weeks before the procedure. These products include cigarettes, chewing tobacco, and vaping devices, such as e-cigarettes. If you need help quitting, ask your health care provider. Ask your health care provider: How your surgery site will be marked. What steps will be taken to help prevent infection. These steps may include: Removing hair at the surgery site. Washing skin with a germ-killing soap. Receiving antibiotic medicine. Plan to have a responsible adult take you home from the hospital or clinic. If you will be going home right after the procedure, plan to have a responsible adult care for you for the time you are told. This is important. What happens during the procedure?  An IV will be inserted into one of your veins. You will be given one or  more of the following: A medicine to help you relax (sedative). A medicine to numb the area (local anesthetic). A medicine to make you fall asleep (general anesthetic). A small incision will be made in your abdomen. A hollow metal tube (trocar) will be placed through the incision. A tube will be placed through the trocar to inflate your abdomen with carbon dioxide. This makes it easier for your surgeon to see inside your abdomen during the repair. A laparoscope will be inserted into your abdomen through the trocar. The laparoscope will send images to a monitor in the operating room. Other trocars will be put through other small incisions in your abdomen. The surgical instruments needed for the procedure will be placed through these trocars. The tissue or intestines that make up the hernia will be moved back into place. The edges of the hernia may be stitched (sutured) together. A piece of mesh will be used to close the hernia. Sutures, clips, or staples will be used to keep the mesh in place. A bandage (dressing) or skin glue will be put over the incisions. The procedure may vary among health care providers and hospitals. What happens after the procedure? Your blood pressure, heart rate, breathing rate, and blood oxygen level will be monitored until you leave the hospital  or clinic. You will continue to receive fluids and medicines through an IV. Your IV will be removed when you can drink clear fluids. You will be given pain medicine as needed. You will be encouraged to get up and walk around as soon as possible. You will be shown how to do deep breathing exercises to help prevent a lung infection. If you were given a sedative during the procedure, it can affect you for several hours. Do not drive or operate machinery until your health care provider says that it is safe. Summary Laparoscopic ventral hernia is an operation to fix a hernia using small incisions. Tell your health care provider  about other medical conditions that you have and about all the medicines that you are taking. Follow instructions from your health care provider about eating and drinking before the procedure. Plan to have a responsible adult take you home from the hospital or clinic. After the procedure, you will be encouraged to walk as soon as possible. You will also be taught how to do deep breathing exercises. This information is not intended to replace advice given to you by your health care provider. Make sure you discuss any questions you have with your health care provider. Document Revised: 05/07/2020 Document Reviewed: 05/07/2020 Elsevier Patient Education  2024 [employer]  General Anesthesia, Adult General anesthesia is the use of medicine to make you fall asleep (unconscious) for a medical procedure. General anesthesia must be used for certain procedures. It is often recommended for surgery or procedures that: Last a long time. Require you to be still or in an unusual position. Are major and can cause blood loss. Affect your breathing. The medicines used for general anesthesia are called general anesthetics. During general anesthesia, these medicines are given along with medicines that: Prevent pain. Control your blood pressure. Relax your muscles. Prevent nausea and vomiting after the procedure. Tell a health care provider about: Any allergies you have. All medicines you are taking, including vitamins, herbs, eye drops, creams, and over-the-counter medicines. Your history of any: Medical conditions you have, including: High blood pressure. Bleeding problems. Diabetes. Heart or lung conditions, such as: Heart failure. Sleep apnea. Asthma. Chronic obstructive pulmonary disease (COPD). Current or recent illnesses, such as: Upper respiratory, chest, or ear infections. Cough or fever. Tobacco or drug use, including marijuana or alcohol use. Depression or anxiety. Surgeries and types  of anesthetics you have had. Problems you or family members have had with anesthetic medicines. Whether you are pregnant or may be pregnant. Whether you have any chipped or loose teeth, dentures, caps, bridgework, or issues with your mouth, swallowing, or choking. What are the risks? Your health care provider will talk with you about risks. These may include: Allergic reaction to the medicines. Lung and heart problems. Inhaling food or liquid from the stomach into the lungs (aspiration). Nerve injury. Injury to the lips, mouth, teeth, or gums. Stroke. Waking up during your procedure and being unable to move. This is rare. These problems are more likely to develop if you are having a major surgery or if you have an advanced or serious medical condition. You can prevent some of these complications by answering all of your health care provider's questions thoroughly and by following all instructions before your procedure. General anesthesia can cause side effects, including: Nausea or vomiting. A sore throat or hoarseness from the breathing tube. Wheezing or coughing. Shaking chills or feeling cold. Body aches. Sleepiness. Confusion, agitation (delirium), or anxiety. What happens before the  procedure? When to stop eating and drinking Follow instructions from your health care provider about what you may eat and drink before your procedure. If you do not follow your health care provider's instructions, your procedure may be delayed or canceled. Medicines Ask your health care provider about: Changing or stopping your regular medicines. These include any diabetes medicines or blood thinners you take. Taking medicines such as aspirin and ibuprofen. These medicines can thin your blood. Do not take them unless your health care provider tells you to. Taking over-the-counter medicines, vitamins, herbs, and supplements. General instructions Do not use any products that contain nicotine or tobacco  for at least 4 weeks before the procedure. These products include cigarettes, chewing tobacco, and vaping devices, such as e-cigarettes. If you need help quitting, ask your health care provider. If you brush your teeth on the morning of the procedure, make sure to spit out all of the water and toothpaste. If told by your health care provider, bring your sleep apnea device with you to surgery (if applicable). If you will be going home right after the procedure, plan to have a responsible adult: Take you home from the hospital or clinic. You will not be allowed to drive. Care for you for the time you are told. What happens during the procedure?  An IV will be inserted into one of your veins. You will be given one or more of the following through a face mask or IV: A sedative. This helps you relax. Anesthesia. This will: Numb certain areas of your body. Make you fall asleep for surgery. After you are unconscious, a breathing tube may be inserted down your throat to help you breathe. This will be removed before you wake up. An anesthesia provider, such as an anesthesiologist, will stay with you throughout your procedure. The anesthesia provider will: Keep you comfortable and safe by continuing to give you medicines and adjusting the amount of medicine that you get. Monitor your blood pressure, heart rate, and oxygen levels to make sure that the anesthetics do not cause any problems. The procedure may vary among health care providers and hospitals. What happens after the procedure? Your blood pressure, temperature, heart rate, breathing rate, and blood oxygen level will be monitored until you leave the hospital or clinic. You will wake up in a recovery area. You may wake up slowly. You may be given medicine to help you with pain, nausea, or any other side effects from the anesthesia. Summary General anesthesia is the use of medicine to make you fall asleep (unconscious) for a medical  procedure. Follow your health care provider's instructions about when to stop eating, drinking, or taking certain medicines before your procedure. Plan to have a responsible adult take you home from the hospital or clinic. This information is not intended to replace advice given to you by your health care provider. Make sure you discuss any questions you have with your health care provider. Document Revised: 12/15/2021 Document Reviewed: 12/15/2021 Elsevier Patient Education  2024 [employer]  How to Use Chlorhexidine Before Surgery Chlorhexidine gluconate (CHG) is a germ-killing (antiseptic) solution that is used to clean the skin. It can get rid of the bacteria that normally live on the skin and can keep them away for about 24 hours. To clean your skin with CHG, you may be given: A CHG solution to use in the shower or as part of a sponge bath. A prepackaged cloth that contains CHG. Cleaning your skin with CHG may help  lower the risk for infection: While you are staying in the intensive care unit of the hospital. If you have a vascular access, such as a central line, to provide short-term or long-term access to your veins. If you have a catheter to drain urine from your bladder. If you are on a ventilator. A ventilator is a machine that helps you breathe by moving air in and out of your lungs. After surgery. What are the risks? Risks of using CHG include: A skin reaction. Hearing loss, if CHG gets in your ears and you have a perforated eardrum. Eye injury, if CHG gets in your eyes and is not rinsed out. The CHG product catching fire. Make sure that you avoid smoking and flames after applying CHG to your skin. Do not use CHG: If you have a chlorhexidine allergy or have previously reacted to chlorhexidine. On babies younger than 68 months of age. How to use CHG solution Use CHG only as told by your health care provider, and follow the instructions on the label. Use the full amount of  CHG as directed. Usually, this is one bottle. During a shower Follow these steps when using CHG solution during a shower (unless your health care provider gives you different instructions): Start the shower. Use your normal soap and shampoo to wash your face and hair. Turn off the shower or move out of the shower stream. Pour the CHG onto a clean washcloth. Do not use any type of brush or rough-edged sponge. Starting at your neck, lather your body down to your toes. Make sure you follow these instructions: If you will be having surgery, pay special attention to the part of your body where you will be having surgery. Scrub this area for at least 1 minute. Do not use CHG on your head or face. If the solution gets into your ears or eyes, rinse them well with water. Avoid your genital area. Avoid any areas of skin that have broken skin, cuts, or scrapes. Scrub your back and under your arms. Make sure to wash skin folds. Let the lather sit on your skin for 1-2 minutes or as long as told by your health care provider. Thoroughly rinse your entire body in the shower. Make sure that all body creases and crevices are rinsed well. Dry off with a clean towel. Do not put any substances on your body afterward--such as powder, lotion, or perfume--unless you are told to do so by your health care provider. Only use lotions that are recommended by the manufacturer. Put on clean clothes or pajamas. If it is the night before your surgery, sleep in clean sheets.  During a sponge bath Follow these steps when using CHG solution during a sponge bath (unless your health care provider gives you different instructions): Use your normal soap and shampoo to wash your face and hair. Pour the CHG onto a clean washcloth. Starting at your neck, lather your body down to your toes. Make sure you follow these instructions: If you will be having surgery, pay special attention to the part of your body where you will be having  surgery. Scrub this area for at least 1 minute. Do not use CHG on your head or face. If the solution gets into your ears or eyes, rinse them well with water. Avoid your genital area. Avoid any areas of skin that have broken skin, cuts, or scrapes. Scrub your back and under your arms. Make sure to wash skin folds. Let the lather sit on  your skin for 1-2 minutes or as long as told by your health care provider. Using a different clean, wet washcloth, thoroughly rinse your entire body. Make sure that all body creases and crevices are rinsed well. Dry off with a clean towel. Do not put any substances on your body afterward--such as powder, lotion, or perfume--unless you are told to do so by your health care provider. Only use lotions that are recommended by the manufacturer. Put on clean clothes or pajamas. If it is the night before your surgery, sleep in clean sheets. How to use CHG prepackaged cloths Only use CHG cloths as told by your health care provider, and follow the instructions on the label. Use the CHG cloth on clean, dry skin. Do not use the CHG cloth on your head or face unless your health care provider tells you to. When washing with the CHG cloth: Avoid your genital area. Avoid any areas of skin that have broken skin, cuts, or scrapes. Before surgery Follow these steps when using a CHG cloth to clean before surgery (unless your health care provider gives you different instructions): Using the CHG cloth, vigorously scrub the part of your body where you will be having surgery. Scrub using a back-and-forth motion for 3 minutes. The area on your body should be completely wet with CHG when you are done scrubbing. Do not rinse. Discard the cloth and let the area air-dry. Do not put any substances on the area afterward, such as powder, lotion, or perfume. Put on clean clothes or pajamas. If it is the night before your surgery, sleep in clean sheets.  For general bathing Follow these steps  when using CHG cloths for general bathing (unless your health care provider gives you different instructions). Use a separate CHG cloth for each area of your body. Make sure you wash between any folds of skin and between your fingers and toes. Wash your body in the following order, switching to a new cloth after each step: The front of your neck, shoulders, and chest. Both of your arms, under your arms, and your hands. Your stomach and groin area, avoiding the genitals. Your right leg and foot. Your left leg and foot. The back of your neck, your back, and your buttocks. Do not rinse. Discard the cloth and let the area air-dry. Do not put any substances on your body afterward--such as powder, lotion, or perfume--unless you are told to do so by your health care provider. Only use lotions that are recommended by the manufacturer. Put on clean clothes or pajamas. Contact a health care provider if: Your skin gets irritated after scrubbing. You have questions about using your solution or cloth. You swallow any chlorhexidine. Call your local poison control center ([phone] in the [place] Get help right away if: Your eyes itch badly, or they become very red or swollen. Your skin itches badly and is red or swollen. Your hearing changes. You have trouble seeing. You have swelling or tingling in your mouth or throat. You have trouble breathing. These symptoms may represent a serious problem that is an emergency. Do not wait to see if the symptoms will go away. Get medical help right away. Call your local emergency services (911 in the [place] Do not drive yourself to the hospital. Summary Chlorhexidine gluconate (CHG) is a germ-killing (antiseptic) solution that is used to clean the skin. Cleaning your skin with CHG may help to lower your risk for infection. You may be given CHG to use for bathing.  It may be in a bottle or in a prepackaged cloth to use on your skin. Carefully follow your health care  provider's instructions and the instructions on the product label. Do not use CHG if you have a chlorhexidine allergy. Contact your health care provider if your skin gets irritated after scrubbing. This information is not intended to replace advice given to you by your health care provider. Make sure you discuss any questions you have with your health care provider. Document Revised: 01/16/2022 Document Reviewed: 11/29/2020 Elsevier Patient Education  2023 [employer].

## 2023-07-04 ENCOUNTER — Encounter (HOSPITAL_COMMUNITY): Payer: Self-pay

## 2023-07-04 ENCOUNTER — Encounter (HOSPITAL_COMMUNITY)
Admission: RE | Admit: 2023-07-04 | Discharge: 2023-07-04 | Disposition: A | Discharge status: Home / Self Care | Payer: Medicare PPO | Source: Ambulatory Visit | Attending: General Surgery | Admitting: General Surgery

## 2023-07-04 VITALS — BP 135/69 | HR 61 | Temp 97.8°F | Resp 18 | Ht 72.0 in | Wt 188.1 lb

## 2023-07-04 DIAGNOSIS — Z01810 Encounter for preprocedural cardiovascular examination: Secondary | ICD-10-CM | POA: Diagnosis not present

## 2023-07-04 DIAGNOSIS — Z01818 Encounter for other preprocedural examination: Secondary | ICD-10-CM

## 2023-07-04 DIAGNOSIS — I10 Essential (primary) hypertension: Secondary | ICD-10-CM | POA: Insufficient documentation

## 2023-07-04 DIAGNOSIS — Z01812 Encounter for preprocedural laboratory examination: Secondary | ICD-10-CM | POA: Insufficient documentation

## 2023-07-04 DIAGNOSIS — E119 Type 2 diabetes mellitus without complications: Secondary | ICD-10-CM | POA: Diagnosis not present

## 2023-07-04 LAB — CBC WITH DIFFERENTIAL/PLATELET
Abs Immature Granulocytes: 0.02 10*3/uL (ref 0.00–0.07)
Basophils Absolute: 0 10*3/uL (ref 0.0–0.1)
Basophils Relative: 0 %
Eosinophils Absolute: 0.1 10*3/uL (ref 0.0–0.5)
Eosinophils Relative: 1 %
HCT: 43.7 % (ref 39.0–52.0)
Hemoglobin: 14.3 g/dL (ref 13.0–17.0)
Immature Granulocytes: 0 %
Lymphocytes Relative: 32 %
Lymphs Abs: 1.9 10*3/uL (ref 0.7–4.0)
MCH: 29 pg (ref 26.0–34.0)
MCHC: 32.7 g/dL (ref 30.0–36.0)
MCV: 88.6 fL (ref 80.0–100.0)
Monocytes Absolute: 0.6 10*3/uL (ref 0.1–1.0)
Monocytes Relative: 11 %
Neutro Abs: 3.2 10*3/uL (ref 1.7–7.7)
Neutrophils Relative %: 56 %
Platelets: 221 10*3/uL (ref 150–400)
RBC: 4.93 MIL/uL (ref 4.22–5.81)
RDW: 12.8 % (ref 11.5–15.5)
WBC: 5.8 10*3/uL (ref 4.0–10.5)
nRBC: 0 % (ref 0.0–0.2)

## 2023-07-04 LAB — BASIC METABOLIC PANEL
Anion gap: 10 (ref 5–15)
BUN: 18 mg/dL (ref 8–23)
CO2: 29 mmol/L (ref 22–32)
Calcium: 9.1 mg/dL (ref 8.9–10.3)
Chloride: 97 mmol/L — ABNORMAL LOW (ref 98–111)
Creatinine, Ser: 1.1 mg/dL (ref 0.61–1.24)
GFR, Estimated: >60 mL/min (ref >60)
Glucose, Bld: 136 mg/dL — ABNORMAL HIGH (ref 70–99)
Potassium: 3.5 mmol/L (ref 3.5–5.1)
Sodium: 136 mmol/L (ref 135–145)

## 2023-07-09 ENCOUNTER — Encounter (HOSPITAL_COMMUNITY): Payer: Self-pay | Admitting: General Surgery

## 2023-07-09 ENCOUNTER — Encounter (HOSPITAL_COMMUNITY)
Admission: RE | Disposition: A | Discharge status: Home / Self Care | Payer: Self-pay | Source: Home / Self Care | Attending: General Surgery

## 2023-07-09 ENCOUNTER — Ambulatory Visit (HOSPITAL_COMMUNITY): Payer: Medicare PPO | Admitting: Certified Registered"

## 2023-07-09 ENCOUNTER — Ambulatory Visit (HOSPITAL_COMMUNITY)
Admission: RE | Admit: 2023-07-09 | Discharge: 2023-07-09 | Disposition: A | Discharge status: Home / Self Care | Payer: Medicare PPO | Attending: General Surgery | Admitting: General Surgery

## 2023-07-09 ENCOUNTER — Ambulatory Visit (HOSPITAL_BASED_OUTPATIENT_CLINIC_OR_DEPARTMENT_OTHER): Payer: Medicare PPO | Admitting: Certified Registered"

## 2023-07-09 DIAGNOSIS — E119 Type 2 diabetes mellitus without complications: Secondary | ICD-10-CM | POA: Insufficient documentation

## 2023-07-09 DIAGNOSIS — K4090 Unilateral inguinal hernia, without obstruction or gangrene, not specified as recurrent: Secondary | ICD-10-CM

## 2023-07-09 DIAGNOSIS — K219 Gastro-esophageal reflux disease without esophagitis: Secondary | ICD-10-CM | POA: Insufficient documentation

## 2023-07-09 DIAGNOSIS — I10 Essential (primary) hypertension: Secondary | ICD-10-CM | POA: Insufficient documentation

## 2023-07-09 HISTORY — PX: XI ROBOTIC ASSISTED INGUINAL HERNIA REPAIR WITH MESH: SHX6706

## 2023-07-09 LAB — GLUCOSE, CAPILLARY
Glucose-Capillary: 96 mg/dL (ref 70–99)
Glucose-Capillary: 124 mg/dL — ABNORMAL HIGH (ref 70–99)

## 2023-07-09 SURGERY — REPAIR, HERNIA, INGUINAL, ROBOT-ASSISTED, LAPAROSCOPIC, USING MESH
Anesthesia: General | Site: Inguinal | Laterality: Right

## 2023-07-09 MED ORDER — ONDANSETRON HCL 4 MG/2ML IJ SOLN: 4.0000 mg | Freq: Once | INTRAMUSCULAR | Status: DC | PRN

## 2023-07-09 MED ORDER — ESMOLOL HCL 100 MG/10ML IV SOLN
INTRAVENOUS | Status: AC
  Filled 2023-07-09: qty 10

## 2023-07-09 MED ORDER — LACTATED RINGERS IV SOLN: INTRAVENOUS | Status: DC

## 2023-07-09 MED ORDER — SUGAMMADEX SODIUM 200 MG/2ML IV SOLN
INTRAVENOUS | Status: DC | PRN
  Administered 2023-07-09: 200 mg via INTRAVENOUS

## 2023-07-09 MED ORDER — FENTANYL CITRATE (PF) 100 MCG/2ML IJ SOLN
INTRAMUSCULAR | Status: AC
  Filled 2023-07-09: qty 2

## 2023-07-09 MED ORDER — PROPOFOL 10 MG/ML IV BOLUS
INTRAVENOUS | Status: AC
  Filled 2023-07-09: qty 20

## 2023-07-09 MED ORDER — ROCURONIUM BROMIDE 10 MG/ML (PF) SYRINGE
PREFILLED_SYRINGE | INTRAVENOUS | Status: AC
  Filled 2023-07-09: qty 10

## 2023-07-09 MED ORDER — TRAMADOL HCL 50 MG PO TABS: 50.0000 mg | ORAL_TABLET | Freq: Four times a day (QID) | ORAL | 0 refills | Status: DC | PRN

## 2023-07-09 MED ORDER — CHLORHEXIDINE GLUCONATE 0.12 % MT SOLN
15.0000 mL | Freq: Once | OROMUCOSAL | Status: AC
  Administered 2023-07-09: 15 mL via OROMUCOSAL
  Filled 2023-07-09: qty 15

## 2023-07-09 MED ORDER — PHENYLEPHRINE 80 MCG/ML (10ML) SYRINGE FOR IV PUSH (FOR BLOOD PRESSURE SUPPORT)
PREFILLED_SYRINGE | INTRAVENOUS | Status: AC
  Filled 2023-07-09: qty 10

## 2023-07-09 MED ORDER — EPHEDRINE SULFATE-NACL 50-0.9 MG/10ML-% IV SOSY
PREFILLED_SYRINGE | INTRAVENOUS | Status: DC | PRN
Start: 2023-07-09 — End: 2023-07-09
  Administered 2023-07-09 (×3): 5 mg via INTRAVENOUS

## 2023-07-09 MED ORDER — KETOROLAC TROMETHAMINE 30 MG/ML IJ SOLN
INTRAMUSCULAR | Status: AC
  Filled 2023-07-09: qty 1

## 2023-07-09 MED ORDER — LIDOCAINE HCL (PF) 2 % IJ SOLN
INTRAMUSCULAR | Status: AC
  Filled 2023-07-09: qty 5

## 2023-07-09 MED ORDER — OXYCODONE HCL 5 MG PO TABS
5.0000 mg | ORAL_TABLET | Freq: Once | ORAL | Status: AC | PRN
  Administered 2023-07-09: 5 mg via ORAL
  Filled 2023-07-09: qty 1

## 2023-07-09 MED ORDER — ONDANSETRON HCL 4 MG/2ML IJ SOLN
INTRAMUSCULAR | Status: DC | PRN
  Administered 2023-07-09: 4 mg via INTRAVENOUS

## 2023-07-09 MED ORDER — EPHEDRINE 5 MG/ML INJ
INTRAVENOUS | Status: AC
  Filled 2023-07-09: qty 5

## 2023-07-09 MED ORDER — ONDANSETRON HCL 4 MG/2ML IJ SOLN
INTRAMUSCULAR | Status: AC
  Filled 2023-07-09: qty 2

## 2023-07-09 MED ORDER — FENTANYL CITRATE (PF) 250 MCG/5ML IJ SOLN
INTRAMUSCULAR | Status: DC | PRN
  Administered 2023-07-09 (×3): 50 ug via INTRAVENOUS

## 2023-07-09 MED ORDER — VANCOMYCIN HCL 1000 MG IV SOLR: INTRAVENOUS | Status: DC | PRN

## 2023-07-09 MED ORDER — ESMOLOL HCL 100 MG/10ML IV SOLN
INTRAVENOUS | Status: DC | PRN
Start: 2023-07-09 — End: 2023-07-09
  Administered 2023-07-09: 20 mg via INTRAVENOUS

## 2023-07-09 MED ORDER — OXYCODONE HCL 5 MG/5ML PO SOLN: 5.0000 mg | Freq: Once | ORAL | Status: AC | PRN

## 2023-07-09 MED ORDER — DEXAMETHASONE SODIUM PHOSPHATE 4 MG/ML IJ SOLN
INTRAMUSCULAR | Status: DC | PRN
  Administered 2023-07-09: 5 mg via INTRAVENOUS

## 2023-07-09 MED ORDER — CHLORHEXIDINE GLUCONATE CLOTH 2 % EX PADS
6.0000 | MEDICATED_PAD | Freq: Once | CUTANEOUS | Status: AC
  Administered 2023-07-09: 6 via TOPICAL

## 2023-07-09 MED ORDER — LIDOCAINE 2% (20 MG/ML) 5 ML SYRINGE
INTRAMUSCULAR | Status: DC | PRN
  Administered 2023-07-09: 100 mg via INTRAVENOUS

## 2023-07-09 MED ORDER — STERILE WATER FOR IRRIGATION IR SOLN
Status: DC | PRN
  Administered 2023-07-09: 500 mL

## 2023-07-09 MED ORDER — PROPOFOL 10 MG/ML IV BOLUS
INTRAVENOUS | Status: DC | PRN
Start: 2023-07-09 — End: 2023-07-09
  Administered 2023-07-09: 120 mg via INTRAVENOUS
  Administered 2023-07-09: 80 mg via INTRAVENOUS

## 2023-07-09 MED ORDER — ROCURONIUM BROMIDE 10 MG/ML (PF) SYRINGE
PREFILLED_SYRINGE | INTRAVENOUS | Status: DC | PRN
  Administered 2023-07-09: 50 mg via INTRAVENOUS
  Administered 2023-07-09 (×2): 10 mg via INTRAVENOUS

## 2023-07-09 MED ORDER — PHENYLEPHRINE 80 MCG/ML (10ML) SYRINGE FOR IV PUSH (FOR BLOOD PRESSURE SUPPORT)
PREFILLED_SYRINGE | INTRAVENOUS | Status: DC | PRN
  Administered 2023-07-09: 160 ug via INTRAVENOUS
  Administered 2023-07-09: 80 ug via INTRAVENOUS

## 2023-07-09 MED ORDER — BUPIVACAINE HCL (PF) 0.5 % IJ SOLN
INTRAMUSCULAR | Status: AC
  Filled 2023-07-09: qty 30

## 2023-07-09 MED ORDER — BUPIVACAINE HCL (PF) 0.5 % IJ SOLN
INTRAMUSCULAR | Status: DC | PRN
  Administered 2023-07-09: 30 mL

## 2023-07-09 MED ORDER — KETOROLAC TROMETHAMINE 30 MG/ML IJ SOLN
INTRAMUSCULAR | Status: DC | PRN
Start: 2023-07-09 — End: 2023-07-09
  Administered 2023-07-09: 30 mg via INTRAVENOUS

## 2023-07-09 MED ORDER — CHLORHEXIDINE GLUCONATE CLOTH 2 % EX PADS: 6.0000 | MEDICATED_PAD | Freq: Once | CUTANEOUS | Status: DC

## 2023-07-09 MED ORDER — FENTANYL CITRATE PF 50 MCG/ML IJ SOSY: 25.0000 ug | PREFILLED_SYRINGE | INTRAMUSCULAR | Status: DC | PRN

## 2023-07-09 MED ORDER — VANCOMYCIN HCL IN DEXTROSE 1-5 GM/200ML-% IV SOLN
1000.0000 mg | INTRAVENOUS | Status: AC
  Administered 2023-07-09: 1000 mg via INTRAVENOUS
  Filled 2023-07-09: qty 200

## 2023-07-09 MED ORDER — DEXAMETHASONE SODIUM PHOSPHATE 10 MG/ML IJ SOLN
INTRAMUSCULAR | Status: AC
  Filled 2023-07-09: qty 1

## 2023-07-09 SURGICAL SUPPLY — 50 items
ADH SKN CLS APL DERMABOND .7 (GAUZE/BANDAGES/DRESSINGS) ×1
APL PRP STRL LF DISP 70% ISPRP (MISCELLANEOUS) ×1
CHLORAPREP W/TINT 26 (MISCELLANEOUS) ×1 IMPLANT
COVER LIGHT HANDLE STERIS (MISCELLANEOUS) ×1 IMPLANT
COVER MAYO STAND STRL (DRAPES) ×1 IMPLANT
COVER MAYO STAND XLG (MISCELLANEOUS) ×1 IMPLANT
COVER TIP SHEARS 8 DVNC (MISCELLANEOUS) ×1 IMPLANT
DERMABOND ADVANCED .7 DNX12 (GAUZE/BANDAGES/DRESSINGS) ×1 IMPLANT
DRAPE ARM DVNC X/XI (DISPOSABLE) ×3 IMPLANT
DRAPE COLUMN DVNC XI (DISPOSABLE) ×1 IMPLANT
DRAPE HALF SHEET 40X57 (DRAPES) ×1 IMPLANT
DRIVER NDL MEGA SUTCUT DVNCXI (INSTRUMENTS) ×1 IMPLANT
DRIVER NDLE MEGA SUTCUT DVNCXI (INSTRUMENTS) ×1
ELECT REM PT RETURN 9FT ADLT (ELECTROSURGICAL) ×1
ELECTRODE REM PT RTRN 9FT ADLT (ELECTROSURGICAL) ×1 IMPLANT
FORCEPS BPLR R/ABLATION 8 DVNC (INSTRUMENTS) ×1 IMPLANT
GAUZE SPONGE 4X4 12PLY STRL (GAUZE/BANDAGES/DRESSINGS) ×1 IMPLANT
GLOVE BIOGEL PI IND STRL 7.0 (GLOVE) ×4 IMPLANT
GLOVE BIOGEL PI IND STRL 8 (GLOVE) IMPLANT
GLOVE SURG SS PI 7.5 STRL IVOR (GLOVE) ×2 IMPLANT
GLOVE SURG SS PI 8.0 STRL IVOR (GLOVE) IMPLANT
GOWN STRL REUS W/TWL LRG LVL3 (GOWN DISPOSABLE) ×2 IMPLANT
GOWN STRL REUS W/TWL XL LVL3 (GOWN DISPOSABLE) IMPLANT
KIT PINK PAD W/HEAD ARE REST (MISCELLANEOUS) ×1
KIT PINK PAD W/HEAD ARM REST (MISCELLANEOUS) ×1 IMPLANT
KIT TURNOVER KIT A (KITS) ×1 IMPLANT
MANIFOLD NEPTUNE II (INSTRUMENTS) ×1 IMPLANT
MESH 3DMAX MID 5X7 RT XLRG (Mesh General) IMPLANT
NDL HYPO 21X1.5 SAFETY (NEEDLE) ×1 IMPLANT
NDL INSUFFLATION 14GA 120MM (NEEDLE) ×1 IMPLANT
NEEDLE HYPO 21X1.5 SAFETY (NEEDLE) ×1
NEEDLE INSUFFLATION 14GA 120MM (NEEDLE) ×1
OBTURATOR OPTICAL STND 8 DVNC (TROCAR) ×1
OBTURATOR OPTICALSTD 8 DVNC (TROCAR) ×1 IMPLANT
PACK LAP CHOLE LZT030E (CUSTOM PROCEDURE TRAY) ×1 IMPLANT
PENCIL HANDSWITCHING (ELECTRODE) ×1 IMPLANT
SCISSORS MNPLR CVD DVNC XI (INSTRUMENTS) ×1 IMPLANT
SEAL UNIV 5-12 XI (MISCELLANEOUS) ×3 IMPLANT
SET BASIN LINEN APH (SET/KITS/TRAYS/PACK) ×1 IMPLANT
SET TUBE SMOKE EVAC HIGH FLOW (TUBING) ×1 IMPLANT
SOL PREP POV-IOD 4OZ 10% (MISCELLANEOUS) ×1 IMPLANT
SUT MNCRL AB 4-0 PS2 18 (SUTURE) ×2 IMPLANT
SUT V-LOC 90 ABS 3-0 VLT V-20 (SUTURE) ×2 IMPLANT
SUT VIC AB 2-0 SH 27 (SUTURE) ×2
SUT VIC AB 2-0 SH 27X BRD (SUTURE) ×1 IMPLANT
SYR 30ML LL (SYRINGE) ×1 IMPLANT
TAPE TRANSPORE STRL 2 31045 (GAUZE/BANDAGES/DRESSINGS) ×1 IMPLANT
TRAY FOL W/BAG SLVR 16FR STRL (SET/KITS/TRAYS/PACK) ×1 IMPLANT
TRAY FOLEY W/BAG SLVR 16FR LF (SET/KITS/TRAYS/PACK) ×1
WATER STERILE IRR 500ML POUR (IV SOLUTION) ×1 IMPLANT

## 2023-07-09 NOTE — Transfer of Care (Signed)
Immediate Anesthesia Transfer of Care Note  Patient: Leonard Flores  Procedure(s) Performed: XI ROBOTIC ASSISTED INGUINAL HERNIA REPAIR WITH MESH (Right: Inguinal)  Patient Location: PACU  Anesthesia Type:General  Level of Consciousness: drowsy  Airway & Oxygen Therapy: Patient Spontanous Breathing and Patient connected to face mask oxygen  Post-op Assessment: Report given to RN and Post -op Vital signs reviewed and stable  Post vital signs: Reviewed and stable  Last Vitals:  Vitals Value Taken Time  BP 159/80 07/09/23 1400  Temp    Pulse 71 07/09/23 1400  Resp 14 07/09/23 1400  SpO2 97 % 07/09/23 1400  Vitals shown include unfiled device data.  Last Pain:  Vitals:   07/09/23 1109  PainSc: 0-No pain         Complications: No notable events documented.

## 2023-07-09 NOTE — Anesthesia Preprocedure Evaluation (Signed)
Anesthesia Evaluation  Patient identified by MRN, date of birth, ID band Patient awake    Reviewed: Allergy & Precautions, H&P , NPO status , Patient's Chart, lab work & pertinent test results  Airway Mallampati: II       Dental no notable dental hx.    Pulmonary neg pulmonary ROS   Pulmonary exam normal        Cardiovascular hypertension, Normal cardiovascular exam     Neuro/Psych negative neurological ROS  negative psych ROS   GI/Hepatic Neg liver ROS,GERD  ,,  Endo/Other  diabetes    Renal/GU negative Renal ROS  negative genitourinary   Musculoskeletal   Abdominal   Peds  Hematology negative hematology ROS (+)   Anesthesia Other Findings   Reproductive/Obstetrics negative OB ROS                             Anesthesia Physical Anesthesia Plan  ASA: 2  Anesthesia Plan: General ETT   Post-op Pain Management:    Induction:   PONV Risk Score and Plan: Ondansetron  Airway Management Planned:   Additional Equipment:   Intra-op Plan:   Post-operative Plan:   Informed Consent:   Plan Discussed with:   Anesthesia Plan Comments:        Anesthesia Quick Evaluation

## 2023-07-09 NOTE — Interval H&P Note (Signed)
History and Physical Interval Note:  07/09/2023 11:16 AM  Leonard Flores  has presented today for surgery, with the diagnosis of Right inguinal hernia.  The various methods of treatment have been discussed with the patient and family. After consideration of risks, benefits and other options for treatment, the patient has consented to  Procedure(s): XI ROBOTIC ASSISTED INGUINAL HERNIA REPAIR WITH MESH (Right) as a surgical intervention.  The patient's history has been reviewed, patient examined, no change in status, stable for surgery.  I have reviewed the patient's chart and labs.  Questions were answered to the patient's satisfaction.     Franky Macho

## 2023-07-09 NOTE — Anesthesia Procedure Notes (Signed)
Procedure Name: Intubation Date/Time: 07/09/2023 12:15 PM  Performed by: Julian Reil, CRNAPre-anesthesia Checklist: Patient identified, Emergency Drugs available, Suction available and Patient being monitored Patient Re-evaluated:Patient Re-evaluated prior to induction Oxygen Delivery Method: Circle system utilized Preoxygenation: Pre-oxygenation with 100% oxygen Induction Type: IV induction Ventilation: Mask ventilation without difficulty and Oral airway inserted - appropriate to patient size Laryngoscope Size: Miller and 3 Grade View: Grade II Tube type: Oral Tube size: 7.5 mm Number of attempts: 1 Airway Equipment and Method: Stylet and Oral airway Placement Confirmation: ETT inserted through vocal cords under direct vision, positive ETCO2 and breath sounds checked- equal and bilateral Secured at: 23 cm Tube secured with: Tape Dental Injury: Teeth and Oropharynx as per pre-operative assessment

## 2023-07-09 NOTE — Op Note (Signed)
Patient:  Leonard Flores  DOB:  09-Aug-1941  MRN:  [id number]   Preop Diagnosis: Right inguinal hernia  Postop Diagnosis: Same  Procedure: Robotic assisted laparoscopic right inguinal herniorrhaphy with mesh  Surgeon: Franky Macho, MD  Anes: General Endotracheal  Indications: Patient is an 82 year old white male who presents with a symptomatic right inguinal hernia.  The risks and benefits of the procedure including bleeding, infection, mesh use, and the possibility of recurrence of the hernia were fully explained to the patient, who gave informed consent.  Procedure note: The patient was placed in the supine position.  After induction of general endotracheal anesthesia, the abdomen and perineum were prepped and draped using usual sterile technique with ChloraPrep.  Surgical site confirmation was performed.  An incision was made in the left upper quadrant at Palmer's point.  A Veress needle was introduced into the abdominal cavity and confirmation of placement was done using the saline drop test.  The abdomen was then insufflated to 15 mmHg pressure.  An 8 mm trocar was introduced into the abdominal cavity under direct visualization without difficulty.  Additional 8 mm trocars were placed in the upper midline region and right upper quadrant region.  The robot was then docked and targeted.  The patient was placed in Trendelenburg position.  The patient had no left inguinal hernia.  The patient did have a right inguinal hernia.  A peritoneal flap was then formed lateral to medial.  This was taken down to Cooper's ligament.  The other lateral side was taken down equidistant.  The patient was noted to have a direct hernia.  This appeared to be approximately 3 cm in its greatest diameter.  The hernia sac was trimmed from the fascia and reduced.  The spermatic cord was inspected and no indirect hernia was noted.  The posterior flap was approximately 6 cm from the defect.  An extra-large Bard 3D max  mesh was then inserted and secured to Cooper's ligament using a 3-0 Vicryl interrupted suture.  Another 3-0 Vicryl interrupted suture was placed anterior to the indirect hernia.  The peritoneal flap was then closed using a 3-0 V-Loc running suture.  A small defect in the peritoneal layer was closed using a 3-0 V-Loc running suture.  Air was evacuated from the abdominal cavity and the peritoneum was noted to lie up against the mesh that was in appropriate position.  The robot was undocked and all air was evacuated from the abdominal cavity prior to removal of the trocars.  All wounds were irrigated with normal saline.  All wounds were injected with 0.5% Sensorcaine.  All incisions were closed using a 4-0 Monocryl subcuticular suture.  Dermabond was applied.  All tape and needle counts were correct at the end of the procedure.  The patient was extubated in the operating room and transferred to PACU in stable condition.    Complications: None  EBL: Minimal  Specimen: None

## 2023-07-11 ENCOUNTER — Encounter (HOSPITAL_COMMUNITY): Payer: Self-pay | Admitting: General Surgery

## 2023-07-11 ENCOUNTER — Other Ambulatory Visit (INDEPENDENT_AMBULATORY_CARE_PROVIDER_SITE_OTHER): Payer: Self-pay | Admitting: Gastroenterology

## 2023-07-13 ENCOUNTER — Other Ambulatory Visit: Payer: Self-pay

## 2023-07-13 ENCOUNTER — Emergency Department (HOSPITAL_COMMUNITY)
Admission: EM | Admit: 2023-07-13 | Discharge: 2023-07-13 | Disposition: A | Discharge status: Home / Self Care | Payer: Medicare PPO | Attending: Emergency Medicine | Admitting: Emergency Medicine

## 2023-07-13 DIAGNOSIS — Z7984 Long term (current) use of oral hypoglycemic drugs: Secondary | ICD-10-CM | POA: Diagnosis not present

## 2023-07-13 DIAGNOSIS — E119 Type 2 diabetes mellitus without complications: Secondary | ICD-10-CM | POA: Insufficient documentation

## 2023-07-13 DIAGNOSIS — I10 Essential (primary) hypertension: Secondary | ICD-10-CM | POA: Diagnosis not present

## 2023-07-13 DIAGNOSIS — R339 Retention of urine, unspecified: Secondary | ICD-10-CM | POA: Insufficient documentation

## 2023-07-13 DIAGNOSIS — Z79899 Other long term (current) drug therapy: Secondary | ICD-10-CM | POA: Diagnosis not present

## 2023-07-13 DIAGNOSIS — R338 Other retention of urine: Secondary | ICD-10-CM

## 2023-07-13 LAB — URINALYSIS, W/ REFLEX TO CULTURE (INFECTION SUSPECTED)
Bacteria, UA: NONE SEEN
Bilirubin Urine: NEGATIVE
Glucose, UA: NEGATIVE mg/dL
Ketones, ur: NEGATIVE mg/dL
Leukocytes,Ua: NEGATIVE
Nitrite: NEGATIVE
Protein, ur: NEGATIVE mg/dL
Specific Gravity, Urine: 1.006 (ref 1.005–1.030)
pH: 7 (ref 5.0–8.0)

## 2023-07-13 NOTE — ED Notes (Signed)
ED Provider at bedside. 

## 2023-07-13 NOTE — Anesthesia Postprocedure Evaluation (Signed)
Anesthesia Post Note  Patient: Leonard Flores  Procedure(s) Performed: XI ROBOTIC ASSISTED INGUINAL HERNIA REPAIR WITH MESH (Right: Inguinal)  Patient location during evaluation: Phase II Anesthesia Type: General Level of consciousness: awake Pain management: pain level controlled Vital Signs Assessment: post-procedure vital signs reviewed and stable Respiratory status: spontaneous breathing and respiratory function stable Cardiovascular status: blood pressure returned to baseline and stable Postop Assessment: no headache and no apparent nausea or vomiting Anesthetic complications: no Comments: Late entry   No notable events documented.   Last Vitals:  Vitals:   07/09/23 1430 07/09/23 1447  BP: (!) 149/76 (!) 154/76  Pulse: 70 70  Resp: 16 20  Temp:  (!) 36.3 C  SpO2: 93% 93%    Last Pain:  Vitals:   07/10/23 1333  TempSrc:   PainSc: 7                  Windell Norfolk

## 2023-07-13 NOTE — ED Provider Notes (Signed)
[place] EMERGENCY DEPARTMENT AT [facility] Provider Note   CSN: [id number] Arrival date & time: 07/13/23  0530     History  Chief Complaint  Patient presents with   Urinary Retention    Leonard Flores is a 82 y.o. male.  The history is provided by the patient.  He has history of hypertension, diabetes, hyperlipidemia and had right inguinal hernia repair 4 days ago, comes in with inability to urinate since yesterday morning.   Home Medications Prior to Admission medications   Medication Sig Start Date End Date Taking? Authorizing Provider  amLODipine (NORVASC) 5 MG tablet Take 5 mg by mouth every evening. 12/04/22   [provider]  atorvastatin (LIPITOR) 20 MG tablet Take 20 mg by mouth in the morning. 10/01/13   [provider]  metFORMIN (GLUCOPHAGE) 500 MG tablet Take 500 mg by mouth 2 (two) times daily. 09/05/19   [provider]  metoprolol (LOPRESSOR) 50 MG tablet Take 50 mg by mouth 2 (two) times daily.    [provider]  mirabegron ER (MYRBETRIQ) 50 MG TB24 tablet Take 50 mg by mouth daily.    [provider]  omeprazole (PRILOSEC) 40 MG capsule TAKE 1 CAPSULE TWICE DAILY 07/11/23   Marguerita Merles, Reuel Boom, MD  traMADol (ULTRAM) 50 MG tablet Take 1 tablet (50 mg total) by mouth every 6 (six) hours as needed. 07/09/23   Franky Macho, MD      Allergies    Penicillins    Review of Systems   Review of Systems  All other systems reviewed and are negative.   Physical Exam Updated Vital Signs BP (!) 187/112 (BP Location: Left Arm)   Pulse 84   Resp 19   Ht 6' (1.829 m)   Wt 85.3 kg   SpO2 99%   BMI 25.50 kg/m  Physical Exam Vitals and nursing note reviewed.   82 year old male, resting comfortably and in no acute distress. Vital signs are significant for elevated blood pressure. Oxygen saturation is 99%, which is normal. Head is normocephalic and atraumatic. PERRLA, EOMI.  Back is nontender and there  is no CVA tenderness. Lungs are clear without rales, wheezes, or rhonchi. Chest is nontender. Heart has regular rate and rhythm without murmur. Abdomen is soft, flat, with moderately distended bladder. Genitalia: Circumcised penis, testes descended. Extremities have no cyanosis or edema. Skin is warm and dry without rash. Neurologic: Mental status is normal, cranial nerves are intact, moves all extremities equally.  ED Results / Procedures / Treatments   Labs (all labs ordered are listed, but only abnormal results are displayed) Labs Reviewed  URINALYSIS, W/ REFLEX TO CULTURE (INFECTION SUSPECTED) - Abnormal; Notable for the following components:      Result Value   Color, Urine STRAW (*)    Hgb urine dipstick SMALL (*)    All other components within normal limits   Procedures Procedures    Medications Ordered in ED Medications - No data to display  ED Course/ Medical Decision Making/ A&P                                 Medical Decision Making  Acute urinary retention.  Bladder scan showed greater than 600 mL of urine in the bladder.  I ordered placement of a Foley catheter which drained more than 1200 mL of clear urine.  I have ordered urine be sent for urinalysis, and  culture if signs of infection are present.  I reviewed his past records, and note robotic assisted right inguinal herniorrhaphy on 07/09/2023.  I have reviewed his laboratory test, and my interpretation is normal urinalysis.  Blood pressure was rechecked after placement of Foley catheter, and has come down considerably.  I am discharging him with Foley catheter in place and referring him to urology for follow-up.  Final Clinical Impression(s) / ED Diagnoses Final diagnoses:  Acute urinary retention  Elevated blood pressure reading with diagnosis of hypertension    Rx / DC Orders ED Discharge Orders     None         Dione Booze, MD 07/13/23 [phone]

## 2023-07-13 NOTE — ED Triage Notes (Signed)
Patient from home for urinary retention. Patient reports he had surgery for a hernia on Monday. Now cannot urinate. Last void yesterday morning. Upon arrival to ER, patient is alert and oriented; obviously uncomfortable. Bladder scan on arrival 

## 2023-07-16 DIAGNOSIS — N400 Enlarged prostate without lower urinary tract symptoms: Secondary | ICD-10-CM | POA: Insufficient documentation

## 2023-07-16 NOTE — Progress Notes (Signed)
Name: Leonard Flores DOB: 1941-03-30 MRN: [id number]  History of Present Illness: Leonard Flores is a 82 y.o. male who presents today as a new patient at [facility]. All available relevant medical records have been reviewed. He is accompanied by his granddaughter Shanda Bumps.  He reports chief complaint of postop urinary retention.  Relevant history:  > 09/12/2019: Prostatomegaly noted on CT.  > 07/04/2023: Normal renal function (GFR >60; creatinine 1.10).  > 07/09/2023: Underwent robotic-assisted laparoscopic right inguinal hernia repair by Dr. Lovell Sheehan.  > 07/13/2023: Seen in ER for acute urinary retention x1 day. Negative urine microscopy. Bladder scan showed >600 mL of urine in the bladder. Foley catheter placed which drained >1200 mL of clear urine.   > 07/19/2023: Postop visit with Dr. Lovell Sheehan. Foley catheter removed.   Today: He denies history of urinary retention prior to current episode however he reports that before surgery he was having persistent LUTS including urinary urgency, frequency, nocturia (>5 times). He states he was drinking a fair bit of caffeine. His PCP had been treating him with Myrbetriq and just increased his Myrbetriq dose from 25 mg to 50 mg a few days prior to his surgery but did not start that until after surgery and states he has only taken it once since surgery.   When he went to the ER he had been constipated until that morning.   Today He reports dysuria and recent sweats. Denies gross hematuria, hesitancy, straining to void, or sensations of incomplete emptying.   Fall Screening: Do you usually have a device to assist in your mobility? No   Medications: Current Outpatient Medications  Medication Sig Dispense Refill   amLODipine (NORVASC) 5 MG tablet Take 5 mg by mouth every evening.     atorvastatin (LIPITOR) 20 MG tablet Take 20 mg by mouth in the morning.     metFORMIN (GLUCOPHAGE) 500 MG tablet Take 500 mg by mouth 2 (two)  times daily.     metoprolol (LOPRESSOR) 50 MG tablet Take 50 mg by mouth 2 (two) times daily.     nitrofurantoin, macrocrystal-monohydrate, (MACROBID) 100 MG capsule Take 1 capsule (100 mg total) by mouth 2 (two) times daily for 7 days. 14 capsule 0   omeprazole (PRILOSEC) 40 MG capsule TAKE 1 CAPSULE TWICE DAILY 180 capsule 1   tamsulosin (FLOMAX) 0.4 MG CAPS capsule Take 1 capsule (0.4 mg total) by mouth daily. 30 capsule 11   traMADol (ULTRAM) 50 MG tablet Take 1 tablet (50 mg total) by mouth every 6 (six) hours as needed. (Patient not taking: Reported on 07/23/2023) 20 tablet 0   No current facility-administered medications for this visit.    Allergies: Allergies  Allergen Reactions   Penicillins Itching    Did it involve swelling of the face/tongue/throat, SOB, or low BP? No Did it involve sudden or severe rash/hives, skin peeling, or any reaction on the inside of your mouth or nose? Yes Did you need to seek medical attention at a hospital or doctor's office? No When did it last happen?   Over 10 years ago If all above answers are "NO", may proceed with cephalosporin use.    Past Medical History:  Diagnosis Date   Diabetes mellitus without complication (HCC)    Elevated cholesterol    Hypertension    Past Surgical History:  Procedure Laterality Date   BACK SURGERY     ESOPHAGOGASTRODUODENOSCOPY (EGD) WITH PROPOFOL N/A 02/02/2023   Procedure: ESOPHAGOGASTRODUODENOSCOPY (EGD) WITH PROPOFOL;  Surgeon: Marguerita Merles,  Reuel Boom, MD;  Location: [facility];  Service: Gastroenterology;  Laterality: N/A;  1:30pm;ASA 3   TUMOR REMOVAL     XI ROBOTIC ASSISTED INGUINAL HERNIA REPAIR WITH MESH Right 07/09/2023   Procedure: XI ROBOTIC ASSISTED INGUINAL HERNIA REPAIR WITH MESH;  Surgeon: Franky Macho, MD;  Location: [facility];  Service: General;  Laterality: Right;   Family History  Problem Relation Age of Onset   Diabetes Mother    Aneurysm Mother    Social History   Socioeconomic  History   Marital status: Married    Spouse name: Not on file   Number of children: Not on file   Years of education: Not on file   Highest education level: Not on file  Occupational History   Not on file  Tobacco Use   Smoking status: Never   Smokeless tobacco: Never  Vaping Use   Vaping status: Never Used  Substance and Sexual Activity   Alcohol use: No   Drug use: No   Sexual activity: Not on file  Other Topics Concern   Not on file  Social History Narrative   Not on file   Social Determinants of Health   Financial Resource Strain: Not on file  Food Insecurity: Not on file  Transportation Needs: Not on file  Physical Activity: Not on file  Stress: Not on file  Social Connections: Not on file  Intimate Partner Violence: Not on file    SUBJECTIVE  Review of Systems Constitutional: Patient denies any unintentional weight loss or change in strength; reports sweats lntegumentary: Patient denies any rashes or pruritus Cardiovascular: Patient denies chest pain or syncope Respiratory: Patient denies shortness of breath Gastrointestinal: Patient denies nausea, vomiting, constipation, or diarrhea Musculoskeletal: Patient denies muscle cramps or weakness Neurologic: Patient denies convulsions or seizures Allergic/Immunologic: Patient denies recent allergic reaction(s) Hematologic/Lymphatic: Patient denies bleeding tendencies Endocrine: Patient denies heat/cold intolerance  GU: As per HPI.  OBJECTIVE Vitals:   07/23/23 1029  BP: 119/75  Pulse: 65  Temp: 98.1 F (36.7 C)   There is no height or weight on file to calculate BMI.  Physical Examination Constitutional: No obvious distress; patient is non-toxic appearing  Cardiovascular: No visible lower extremity edema.  Respiratory: The patient does not have audible wheezing/stridor; respirations do not appear labored  Gastrointestinal: Abdomen non-distended Musculoskeletal: Normal ROM of UEs  Skin: No obvious  rashes/open sores  Neurologic: CN 2-12 grossly intact Psychiatric: Answered questions appropriately with normal affect  Hematologic/Lymphatic/Immunologic: No obvious bruises or sites of spontaneous bleeding  UA: positive for nitrites, moderate leukocytes, trace blood PVR: 0 ml   ASSESSMENT Postoperative urinary retention - Plan: Urinalysis, Routine w reflex microscopic, BLADDER SCAN AMB NON-IMAGING, nitrofurantoin, macrocrystal-monohydrate, (MACROBID) 100 MG capsule, Urine Culture, Urinalysis, dipstick only, CANCELED: Urine culture  Benign prostatic hyperplasia with urinary frequency - Plan: Urinalysis, Routine w reflex microscopic, BLADDER SCAN AMB NON-IMAGING, tamsulosin (FLOMAX) 0.4 MG CAPS capsule, Urine Culture, Urinalysis, dipstick only  Dysuria - Plan: nitrofurantoin, macrocrystal-monohydrate, (MACROBID) 100 MG capsule, Urine Culture, Urinalysis, dipstick only, CANCELED: Urine culture  Night sweats - Plan: nitrofurantoin, macrocrystal-monohydrate, (MACROBID) 100 MG capsule, Urine Culture, Urinalysis, dipstick only, CANCELED: Urine culture  Abnormal urinalysis - Plan: nitrofurantoin, macrocrystal-monohydrate, (MACROBID) 100 MG capsule, Urine Culture, Urinalysis, dipstick only, CANCELED: Urine culture  UTI symptoms - Plan: nitrofurantoin, macrocrystal-monohydrate, (MACROBID) 100 MG capsule, Urine Culture, Urinalysis, dipstick only, CANCELED: Urine culture  We discussed pt's urinary retention and possible etiologies including temporary detrusor areflexia, neurogenic bladder, BPH, constipation, anticholinergic / beta 3  agonist medication use.   We reviewed the diagnosis of BPH/BOO and how that can contribute to LUTS. Options for treatment included alpha blockers, 5-AR inhibitors, and surgery were discussed. He agreed to start Flomax 0.4 mg daily. Potential side effects were discussed.  He is somewhat concerned about risk for prostate cancer. No prior PSA results in chart at this time. We  discussed PSA, which is a protein produced in normal and neoplastic cells. Age norms and PSA velocity were discussed. He was reassured that his urinary retention was likely due to BPH and that if PSA values have been normal up to age 40 they often do not need to be continued. Will request prior PSA records from his PCP for review.   He was advised to discontinue Myrbetriq since that can increase the risk for incomplete bladder emptying / urinary retention and to minimize his caffeine intake to reduce LUTS.   Abnormal UA with UTI symptoms. We agreed to send urine for culture and to treat empirically with Nitrofurantoin while awaiting culture results and sensitivities.  Will plan for follow up in 4 weeks or sooner if needed. Pt verbalized understanding and agreement. All questions were answered.  PLAN Advised the following: For suspected CAUTI: - Urine culture. - Nitrofurantoin 100 mg 2x/day x7 days. For BPH with LUTS and recent postop urinary retention: - Start Flomax 0.4 mg daily. - Discontinue Myrbetriq.  - Minimize caffeine intake. - Requesting prior PSA records for review. Return in about 4 weeks (around 08/20/2023) for UA, PVR, & f/u with Evette Georges NP.  Orders Placed This Encounter  Procedures   Urine Culture   Urinalysis, Routine w reflex microscopic   Urinalysis, dipstick only   BLADDER SCAN AMB NON-IMAGING    It has been explained that the patient is to follow regularly with their PCP in addition to all other providers involved in their care and to follow instructions provided by these respective offices. Patient advised to contact urology clinic if any urologic-pertaining questions, concerns, new symptoms or problems arise in the interim period.  There are no Patient Instructions on file for this visit.  Electronically signed by:  Donnita Falls, MSN, FNP-C, CUNP 07/23/2023 1:59 PM

## 2023-07-19 ENCOUNTER — Encounter: Payer: Self-pay | Admitting: General Surgery

## 2023-07-19 ENCOUNTER — Ambulatory Visit (INDEPENDENT_AMBULATORY_CARE_PROVIDER_SITE_OTHER): Payer: Medicare PPO | Admitting: General Surgery

## 2023-07-19 VITALS — BP 162/85 | HR 62 | Temp 97.6°F | Resp 16 | Ht 72.0 in | Wt 186.0 lb

## 2023-07-19 DIAGNOSIS — Z09 Encounter for follow-up examination after completed treatment for conditions other than malignant neoplasm: Secondary | ICD-10-CM

## 2023-07-19 NOTE — Progress Notes (Signed)
Subjective:     Leonard Flores  Patient here for postoperative visit, status post robotic assisted laparoscopic right inguinal herniorrhaphy with mesh.  Postoperatively, patient had urinary retention and had a Foley catheter placed in the emergency room.  He denies any incisional pain.  He is otherwise pleased with the results.  Patient states he has had some issues with voiding which are being addressed by his primary care physician. Objective:    BP (!) 162/85   Pulse 62   Temp 97.6 F (36.4 C) (Oral)   Resp 16   Ht 6' (1.829 m)   Wt 186 lb (84.4 kg)   SpO2 97%   BMI 25.23 kg/m   General:  alert, cooperative, and no distress  Abdomen is soft, incisions healing well.  Foley catheter removed.     Assessment:    Doing well postoperatively. Urinary retention resolved    Plan:   Patient may increase his activity as able.  I told him to follow-up with his primary care physician should he continue to have voiding issues.  Follow-up here as needed.

## 2023-07-23 ENCOUNTER — Encounter: Payer: Self-pay | Admitting: Urology

## 2023-07-23 ENCOUNTER — Telehealth: Payer: Self-pay

## 2023-07-23 ENCOUNTER — Other Ambulatory Visit: Payer: Self-pay

## 2023-07-23 ENCOUNTER — Ambulatory Visit: Payer: Medicare PPO | Admitting: Urology

## 2023-07-23 VITALS — BP 119/75 | HR 65 | Temp 98.1°F

## 2023-07-23 DIAGNOSIS — R338 Other retention of urine: Secondary | ICD-10-CM

## 2023-07-23 DIAGNOSIS — R350 Frequency of micturition: Secondary | ICD-10-CM | POA: Diagnosis not present

## 2023-07-23 DIAGNOSIS — R300 Dysuria: Secondary | ICD-10-CM | POA: Diagnosis not present

## 2023-07-23 DIAGNOSIS — Z978 Presence of other specified devices: Secondary | ICD-10-CM

## 2023-07-23 DIAGNOSIS — N9989 Other postprocedural complications and disorders of genitourinary system: Secondary | ICD-10-CM

## 2023-07-23 DIAGNOSIS — R339 Retention of urine, unspecified: Secondary | ICD-10-CM | POA: Diagnosis not present

## 2023-07-23 DIAGNOSIS — R399 Unspecified symptoms and signs involving the genitourinary system: Secondary | ICD-10-CM | POA: Diagnosis not present

## 2023-07-23 DIAGNOSIS — R8290 Unspecified abnormal findings in urine: Secondary | ICD-10-CM

## 2023-07-23 DIAGNOSIS — N401 Enlarged prostate with lower urinary tract symptoms: Secondary | ICD-10-CM

## 2023-07-23 DIAGNOSIS — N400 Enlarged prostate without lower urinary tract symptoms: Secondary | ICD-10-CM

## 2023-07-23 DIAGNOSIS — R61 Generalized hyperhidrosis: Secondary | ICD-10-CM | POA: Diagnosis not present

## 2023-07-23 LAB — URINALYSIS, DIPSTICK ONLY
Glucose: NEGATIVE
Ketones, urine: NEGATIVE
Nitrite: POSITIVE
Protein: 30
Uroporphyrin, Urine: 2

## 2023-07-23 MED ORDER — NITROFURANTOIN MONOHYD MACRO 100 MG PO CAPS: 100.0000 mg | ORAL_CAPSULE | Freq: Two times a day (BID) | ORAL | 0 refills | Status: AC

## 2023-07-23 MED ORDER — TAMSULOSIN HCL 0.4 MG PO CAPS: 0.4000 mg | ORAL_CAPSULE | Freq: Every day | ORAL | 11 refills | Status: DC

## 2023-07-23 NOTE — Telephone Encounter (Signed)
Flomax rx updated and sent to [facility] per patient request.

## 2023-07-23 NOTE — Telephone Encounter (Signed)
Patient is requesting the "new" medication today be filled by  [facility] - [place], [place] - [address] Phone: [phone]  Fax: [phone]

## 2023-07-24 LAB — MICROSCOPIC EXAMINATION
Casts: NONE SEEN /[LPF]
Epithelial Cells (non renal): NONE SEEN /[HPF] (ref 0–10)
WBC, UA: >30 /[HPF] — AB (ref 0–5)

## 2023-07-24 LAB — URINALYSIS, ROUTINE W REFLEX MICROSCOPIC
Bilirubin, UA: NEGATIVE
Glucose, UA: NEGATIVE
Ketones, UA: NEGATIVE
Nitrite, UA: POSITIVE — AB
Specific Gravity, UA: 1.021 (ref 1.005–1.030)
Urobilinogen, Ur: 1 mg/dL (ref 0.2–1.0)
pH, UA: 6 (ref 5.0–7.5)

## 2023-07-26 NOTE — Progress Notes (Signed)
Letter sent.

## 2023-07-27 LAB — URINE CULTURE

## 2023-07-30 NOTE — Progress Notes (Signed)
Letter sent.

## 2023-08-16 NOTE — Progress Notes (Signed)
Name: Leonard Flores DOB: 1941-05-14 MRN: [id number]  History of Present Illness: Leonard Flores is a 82 y.o. male who presents today for follow up visit at [facility].  - GU history: 1. BPH with LUTS including urinary urgency, frequency, nocturia (>5x/night). - 09/12/2019: Prostatomegaly noted on CT. - 05/24/2022: PSA was normal (3.5). - Denies family history of prostate cancer.  Recent history: > 07/09/2023: Underwent robotic-assisted laparoscopic right inguinal hernia repair by Dr. Lovell Sheehan.   > 07/13/2023: Seen in ER for acute urinary retention x1 day. Negative urine microscopy. Bladder scan showed >600 mL of urine in the bladder. Foley catheter placed which drained >1200 mL of clear urine.    > 07/19/2023: Postop visit with Dr. Lovell Sheehan. Foley catheter removed.   At initial visit on 07/23/2023: - Seen for follow up after episode of postop urinary retention.  - PVR = 0 ml. - Reported dysuria and recent sweats.  - The plan was:  For suspected CAUTI: Nitrofurantoin 100 mg 2x/day x7 days. For BPH with LUTS and recent postop urinary retention: Start Flomax 0.4 mg daily. Discontinue Myrbetriq. Minimize caffeine intake. - Urine culture came back positive for Citrobacter koseri (pan-sensitive).  Today: He reports improved nocturia since starting Flomax 0.4 mg daily; use to void 6-7 times per night and is now going 2x/night. He denies urinary hesitancy, daytime frequency, dysuria, gross hematuria, straining to void, or sensations of incomplete emptying. Reports ongoing urinary urgency which he states is manageable at this time; he tried decreasing his caffeine intake (primarily coffee) and did not notice any significant difference.   Fall Screening: Do you usually have a device to assist in your mobility? No   Medications: Current Outpatient Medications  Medication Sig Dispense Refill   amLODipine (NORVASC) 5 MG tablet Take 5 mg by mouth every evening.      atorvastatin (LIPITOR) 20 MG tablet Take 20 mg by mouth in the morning.     metFORMIN (GLUCOPHAGE) 500 MG tablet Take 500 mg by mouth 2 (two) times daily.     metoprolol (LOPRESSOR) 50 MG tablet Take 50 mg by mouth 2 (two) times daily.     omeprazole (PRILOSEC) 40 MG capsule TAKE 1 CAPSULE TWICE DAILY 180 capsule 1   tamsulosin (FLOMAX) 0.4 MG CAPS capsule Take 1 capsule (0.4 mg total) by mouth daily. 30 capsule 11   traMADol (ULTRAM) 50 MG tablet Take 1 tablet (50 mg total) by mouth every 6 (six) hours as needed. 20 tablet 0   No current facility-administered medications for this visit.    Allergies: Allergies  Allergen Reactions   Penicillins Itching    Did it involve swelling of the face/tongue/throat, SOB, or low BP? No Did it involve sudden or severe rash/hives, skin peeling, or any reaction on the inside of your mouth or nose? Yes Did you need to seek medical attention at a hospital or doctor's office? No When did it last happen?   Over 10 years ago If all above answers are "NO", may proceed with cephalosporin use.    Past Medical History:  Diagnosis Date   Diabetes mellitus without complication (HCC)    Elevated cholesterol    Hypertension    Past Surgical History:  Procedure Laterality Date   BACK SURGERY     ESOPHAGOGASTRODUODENOSCOPY (EGD) WITH PROPOFOL N/A 02/02/2023   Procedure: ESOPHAGOGASTRODUODENOSCOPY (EGD) WITH PROPOFOL;  Surgeon: Dolores Frame, MD;  Location: [facility];  Service: Gastroenterology;  Laterality: N/A;  1:30pm;ASA 3   TUMOR  REMOVAL     XI ROBOTIC ASSISTED INGUINAL HERNIA REPAIR WITH MESH Right 07/09/2023   Procedure: XI ROBOTIC ASSISTED INGUINAL HERNIA REPAIR WITH MESH;  Surgeon: Franky Macho, MD;  Location: [facility];  Service: General;  Laterality: Right;   Family History  Problem Relation Age of Onset   Diabetes Mother    Aneurysm Mother    Social History   Socioeconomic History   Marital status: Married    Spouse name: Not on  file   Number of children: Not on file   Years of education: Not on file   Highest education level: Not on file  Occupational History   Not on file  Tobacco Use   Smoking status: Never   Smokeless tobacco: Never  Vaping Use   Vaping status: Never Used  Substance and Sexual Activity   Alcohol use: No   Drug use: No   Sexual activity: Not on file  Other Topics Concern   Not on file  Social History Narrative   Not on file   Social Determinants of Health   Financial Resource Strain: Not on file  Food Insecurity: Not on file  Transportation Needs: Not on file  Physical Activity: Not on file  Stress: Not on file  Social Connections: Not on file  Intimate Partner Violence: Not on file    Review of Systems Constitutional: Patient denies any unintentional weight loss or change in strength lntegumentary: Patient denies any rashes or pruritus Cardiovascular: Patient denies chest pain or syncope Respiratory: Patient denies shortness of breath Gastrointestinal: Patient denies nausea, vomiting, constipation, or diarrhea Musculoskeletal: Patient denies muscle cramps or weakness Neurologic: Patient denies convulsions or seizures Allergic/Immunologic: Patient denies recent allergic reaction(s) Hematologic/Lymphatic: Patient denies bleeding tendencies Endocrine: Patient denies heat/cold intolerance  GU: As per HPI.  OBJECTIVE Vitals:   08/21/23 1057  BP: (!) 162/81  Pulse: 63  Temp: 97.6 F (36.4 C)   Body mass index is 25.09 kg/m.  Physical Examination Constitutional: No obvious distress; patient is non-toxic appearing  Cardiovascular: No visible lower extremity edema.  Respiratory: The patient does not have audible wheezing/stridor; respirations do not appear labored  Gastrointestinal: Abdomen non-distended Musculoskeletal: Normal ROM of UEs  Skin: No obvious rashes/open sores  Neurologic: CN 2-12 grossly intact Psychiatric: Answered questions appropriately with normal  affect  Hematologic/Lymphatic/Immunologic: No obvious bruises or sites of spontaneous bleeding  UA: no evidence of UTI or microscopic hematuria PVR: 0 ml  ASSESSMENT Benign prostatic hyperplasia with nocturia - Plan: Urinalysis, Routine w reflex microscopic, BLADDER SCAN AMB NON-IMAGING  He is doing well. Discussed option to restart Myrbetriq at 25 mg daily for his urinary urgency; discussed risk for rentetion with that. He elected to proceed with Flomax alone.  Will plan for follow up in 1 year or sooner if needed. Pt verbalized understanding and agreement. All questions were answered.  PLAN Advised the following: 1. Continue Flomax 0.4 mg daily. 2. Return in about 1 year (around 08/20/2024) for UA, PVR, & f/u with Evette Georges NP.  Orders Placed This Encounter  Procedures   Urinalysis, Routine w reflex microscopic   BLADDER SCAN AMB NON-IMAGING    It has been explained that the patient is to follow regularly with their PCP in addition to all other providers involved in their care and to follow instructions provided by these respective offices. Patient advised to contact urology clinic if any urologic-pertaining questions, concerns, new symptoms or problems arise in the interim period.  There are no Patient Instructions on file  for this visit.  Electronically signed by:  Donnita Falls, FNP   08/21/23    11:35 AM

## 2023-08-21 ENCOUNTER — Encounter: Payer: Self-pay | Admitting: Urology

## 2023-08-21 ENCOUNTER — Ambulatory Visit: Payer: Medicare PPO | Admitting: Urology

## 2023-08-21 VITALS — BP 162/81 | HR 63 | Temp 97.6°F | Ht 72.0 in | Wt 185.0 lb

## 2023-08-21 DIAGNOSIS — N401 Enlarged prostate with lower urinary tract symptoms: Secondary | ICD-10-CM

## 2023-08-21 DIAGNOSIS — R351 Nocturia: Secondary | ICD-10-CM | POA: Diagnosis not present

## 2023-08-21 LAB — URINALYSIS, ROUTINE W REFLEX MICROSCOPIC
Bilirubin, UA: NEGATIVE
Glucose, UA: NEGATIVE
Ketones, UA: NEGATIVE
Leukocytes,UA: NEGATIVE
Nitrite, UA: NEGATIVE
Protein,UA: NEGATIVE
Specific Gravity, UA: <1.005 — ABNORMAL LOW (ref 1.005–1.030)
Urobilinogen, Ur: 0.2 mg/dL (ref 0.2–1.0)
pH, UA: 6 (ref 5.0–7.5)

## 2023-08-21 LAB — MICROSCOPIC EXAMINATION
Bacteria, UA: NONE SEEN
Epithelial Cells (non renal): NONE SEEN /[HPF] (ref 0–10)
WBC, UA: NONE SEEN /[HPF] (ref 0–5)

## 2023-08-21 NOTE — Progress Notes (Addendum)
post void residual=0 ?

## 2023-09-12 DIAGNOSIS — Z1329 Encounter for screening for other suspected endocrine disorder: Secondary | ICD-10-CM | POA: Diagnosis not present

## 2023-09-12 DIAGNOSIS — E7801 Familial hypercholesterolemia: Secondary | ICD-10-CM | POA: Diagnosis not present

## 2023-09-12 DIAGNOSIS — N183 Chronic kidney disease, stage 3 unspecified: Secondary | ICD-10-CM | POA: Diagnosis not present

## 2023-09-12 DIAGNOSIS — E1169 Type 2 diabetes mellitus with other specified complication: Secondary | ICD-10-CM | POA: Diagnosis not present

## 2023-09-12 DIAGNOSIS — R7301 Impaired fasting glucose: Secondary | ICD-10-CM | POA: Diagnosis not present

## 2023-09-12 DIAGNOSIS — E7849 Other hyperlipidemia: Secondary | ICD-10-CM | POA: Diagnosis not present

## 2023-09-12 DIAGNOSIS — N401 Enlarged prostate with lower urinary tract symptoms: Secondary | ICD-10-CM | POA: Diagnosis not present

## 2023-09-21 DIAGNOSIS — N9989 Other postprocedural complications and disorders of genitourinary system: Secondary | ICD-10-CM | POA: Diagnosis not present

## 2023-09-21 DIAGNOSIS — E7849 Other hyperlipidemia: Secondary | ICD-10-CM | POA: Diagnosis not present

## 2023-09-21 DIAGNOSIS — E1169 Type 2 diabetes mellitus with other specified complication: Secondary | ICD-10-CM | POA: Diagnosis not present

## 2023-09-21 DIAGNOSIS — I700 Atherosclerosis of aorta: Secondary | ICD-10-CM | POA: Diagnosis not present

## 2023-09-21 DIAGNOSIS — I10 Essential (primary) hypertension: Secondary | ICD-10-CM | POA: Diagnosis not present

## 2023-09-21 DIAGNOSIS — N1831 Chronic kidney disease, stage 3a: Secondary | ICD-10-CM | POA: Diagnosis not present

## 2023-09-21 DIAGNOSIS — K4090 Unilateral inguinal hernia, without obstruction or gangrene, not specified as recurrent: Secondary | ICD-10-CM | POA: Diagnosis not present

## 2023-09-21 DIAGNOSIS — K219 Gastro-esophageal reflux disease without esophagitis: Secondary | ICD-10-CM | POA: Diagnosis not present

## 2023-09-21 DIAGNOSIS — N401 Enlarged prostate with lower urinary tract symptoms: Secondary | ICD-10-CM | POA: Diagnosis not present

## 2023-09-23 ENCOUNTER — Encounter (HOSPITAL_COMMUNITY): Payer: Self-pay | Admitting: *Deleted

## 2023-09-23 ENCOUNTER — Emergency Department (HOSPITAL_COMMUNITY)
Admission: EM | Admit: 2023-09-23 | Discharge: 2023-09-24 | Disposition: A | Discharge status: Home / Self Care | Payer: Medicare PPO | Attending: Emergency Medicine | Admitting: Emergency Medicine

## 2023-09-23 ENCOUNTER — Emergency Department (HOSPITAL_COMMUNITY): Payer: Medicare PPO

## 2023-09-23 ENCOUNTER — Other Ambulatory Visit: Payer: Self-pay

## 2023-09-23 DIAGNOSIS — S8992XA Unspecified injury of left lower leg, initial encounter: Secondary | ICD-10-CM | POA: Diagnosis not present

## 2023-09-23 DIAGNOSIS — S4992XA Unspecified injury of left shoulder and upper arm, initial encounter: Secondary | ICD-10-CM | POA: Diagnosis not present

## 2023-09-23 DIAGNOSIS — W0110XA Fall on same level from slipping, tripping and stumbling with subsequent striking against unspecified object, initial encounter: Secondary | ICD-10-CM | POA: Insufficient documentation

## 2023-09-23 DIAGNOSIS — S40012A Contusion of left shoulder, initial encounter: Secondary | ICD-10-CM | POA: Diagnosis not present

## 2023-09-23 DIAGNOSIS — S80212A Abrasion, left knee, initial encounter: Secondary | ICD-10-CM | POA: Diagnosis not present

## 2023-09-23 DIAGNOSIS — M19012 Primary osteoarthritis, left shoulder: Secondary | ICD-10-CM | POA: Diagnosis not present

## 2023-09-23 MED ORDER — NAPROXEN 250 MG PO TABS
500.0000 mg | ORAL_TABLET | Freq: Once | ORAL | Status: AC
  Administered 2023-09-23: 500 mg via ORAL
  Filled 2023-09-23: qty 2

## 2023-09-23 MED ORDER — NAPROXEN 500 MG PO TABS: 500.0000 mg | ORAL_TABLET | Freq: Two times a day (BID) | ORAL | 0 refills | Status: DC

## 2023-09-23 NOTE — ED Provider Notes (Signed)
[place] EMERGENCY DEPARTMENT AT [facility] Provider Note   CSN: [id number] Arrival date & time: 09/23/23  1708     History  Chief Complaint  Patient presents with   Shoulder Injury    Leonard Flores is a 82 y.o. male.   Shoulder Injury  This patient is an 82 year old male, he is not anticoagulated, he had tripped landing on his left side striking his left shoulder and his left knee.  The patient reports pain in those areas, he has no injury to his head or neck.  He has no difficulty breathing, no chest injury, no abdominal discomfort.     Home Medications Prior to Admission medications   Medication Sig Start Date End Date Taking? Authorizing Provider  naproxen (NAPROSYN) 500 MG tablet Take 1 tablet (500 mg total) by mouth 2 (two) times daily with a meal. 09/23/23  Yes Eber Hong, MD  amLODipine (NORVASC) 5 MG tablet Take 5 mg by mouth every evening. 12/04/22   [provider]  atorvastatin (LIPITOR) 20 MG tablet Take 20 mg by mouth in the morning. 10/01/13   [provider]  metFORMIN (GLUCOPHAGE) 500 MG tablet Take 500 mg by mouth 2 (two) times daily. 09/05/19   [provider]  metoprolol (LOPRESSOR) 50 MG tablet Take 50 mg by mouth 2 (two) times daily.    [provider]  omeprazole (PRILOSEC) 40 MG capsule TAKE 1 CAPSULE TWICE DAILY 07/11/23   Marguerita Merles, Reuel Boom, MD  tamsulosin (FLOMAX) 0.4 MG CAPS capsule Take 1 capsule (0.4 mg total) by mouth daily. 07/23/23   Donnita Falls, FNP  traMADol (ULTRAM) 50 MG tablet Take 1 tablet (50 mg total) by mouth every 6 (six) hours as needed. 07/09/23   Franky Macho, MD      Allergies    Penicillins    Review of Systems   Review of Systems  All other systems reviewed and are negative.   Physical Exam Updated Vital Signs BP (!) 150/76 (BP Location: Right Arm)   Pulse 73   Temp 98.7 F (37.1 C) (Oral)   Resp 16   Ht 1.829 m (6')   Wt 88.5 kg   SpO2 95%   BMI  26.45 kg/m  Physical Exam Vitals and nursing note reviewed.  Constitutional:      General: He is not in acute distress.    Appearance: He is well-developed.  HENT:     Head: Normocephalic and atraumatic.     Mouth/Throat:     Pharynx: No oropharyngeal exudate.  Eyes:     General: No scleral icterus.       Right eye: No discharge.        Left eye: No discharge.     Conjunctiva/sclera: Conjunctivae normal.     Pupils: Pupils are equal, round, and reactive to light.  Neck:     Thyroid: No thyromegaly.     Vascular: No JVD.  Cardiovascular:     Rate and Rhythm: Normal rate and regular rhythm.     Heart sounds: Normal heart sounds. No murmur heard.    No friction rub. No gallop.  Pulmonary:     Effort: Pulmonary effort is normal. No respiratory distress.     Breath sounds: Normal breath sounds. No wheezing or rales.  Abdominal:     General: Bowel sounds are normal. There is no distension.     Palpations: Abdomen is soft. There is no mass.     Tenderness: There is no abdominal  tenderness.  Musculoskeletal:        General: Tenderness present. No swelling. Normal range of motion.     Cervical back: Normal range of motion and neck supple.     Right lower leg: No edema.     Left lower leg: No edema.     Comments: Abrasion over the anterior left knee over the inferior patella, he is able to straight leg raise on the left with full range of motion.  His left arm has decreased range of motion at the shoulder, there is mild tenderness over the left shoulder.  There is normal range of motion at the elbow the wrist.  Normal sensation of the left hand, normal pulses at the left wrist.  Lymphadenopathy:     Cervical: No cervical adenopathy.  Skin:    General: Skin is warm and dry.     Findings: No erythema or rash.  Neurological:     Mental Status: He is alert.     Coordination: Coordination normal.  Psychiatric:        Behavior: Behavior normal.     ED Results / Procedures /  Treatments   Labs (all labs ordered are listed, but only abnormal results are displayed) Labs Reviewed - No data to display  EKG None  Radiology DG Knee Complete 4 Views Left Result Date: 09/23/2023 CLINICAL DATA:  Fall.  Left knee injury. EXAM: LEFT KNEE - COMPLETE 4+ VIEW COMPARISON:  None Available. FINDINGS: No evidence of fracture, dislocation, or joint effusion. Mild medial compartmental joint space narrowing. Prepatellar soft tissue swelling. No radiopaque foreign body. IMPRESSION: No acute osseous abnormality. Electronically Signed   By: Hart Robinsons M.D.   On: 09/23/2023 18:40   DG Humerus Left Result Date: 09/23/2023 CLINICAL DATA:  Fall with left shoulder injury. EXAM: LEFT HUMERUS - 2+ VIEW COMPARISON:  None Available. FINDINGS: There is no evidence of acute fracture or other focal bone lesions. Soft tissue swelling of the left shoulder. IMPRESSION: No acute osseous abnormality. Electronically Signed   By: Hart Robinsons M.D.   On: 09/23/2023 18:37   DG Shoulder Left Result Date: 09/23/2023 CLINICAL DATA:  Fall with left shoulder injury. EXAM: LEFT SHOULDER - 2+ VIEW COMPARISON:  Left shoulder radiographs dated September 19, 2022. FINDINGS: There is no evidence of fracture or dislocation. Mild glenohumeral joint space narrowing and glenoid rim osteophytosis. The acromioclavicular joint is anatomically aligned with mild joint space narrowing and osteophytosis. Soft tissue swelling of the left shoulder is noted. IMPRESSION: 1. No evidence of acute fracture or dislocation of the left shoulder. 2. Moderate acromioclavicular and mild glenohumeral osteoarthritis. Electronically Signed   By: Hart Robinsons M.D.   On: 09/23/2023 18:36    Procedures Procedures    Medications Ordered in ED Medications  naproxen (NAPROSYN) tablet 500 mg (500 mg Oral Given 09/23/23 1824)    ED Course/ Medical Decision Making/ A&P                                 Medical Decision  Making Amount and/or Complexity of Data Reviewed Radiology: ordered.  Risk Prescription drug management.   Exam consistent with either contusion of the shoulder or possibly a Small humerus or joint injury.  His left knee will need to be x-rayed as well although he has good range of motion.  The patient is agreeable, his vitals are unremarkable, no signs of head injury.  The patient is agreeable  to the workup.  Nursing placed sling  Patient given results  Naprosyn for pain        Final Clinical Impression(s) / ED Diagnoses Final diagnoses:  Contusion of left shoulder, initial encounter    Rx / DC Orders ED Discharge Orders          Ordered    naproxen (NAPROSYN) 500 MG tablet  2 times daily with meals        09/23/23 1856              Eber Hong, MD 09/23/23 1857

## 2023-09-23 NOTE — ED Notes (Signed)
ED Provider at bedside. 

## 2023-09-23 NOTE — Discharge Instructions (Signed)
Your x-rays are normal  RICE therapy:  Apply ice wrapped in a towel intermittently keeping it on the skin no longer than 10 minutes a couple of times an hour  Elevate the affected extremity to help reduce blood flow and prevent swelling  Use an anti-inflammatory if you are not allergic to it such as ibuprofen or Naprosyn to help with pain and swelling  Use a compressive device whether it is an Ace wrap or other  immobilizer to help minimize movement and compress the swelling.  Thank you for allowing [place] to treat you in the emergency department today.  After reviewing your examination and potential testing that was done it appears that you are safe to go home.  I would like for you to follow-up with your doctor within the next several days, have them obtain your records and follow-up with them to review all potential tests and results from your visit.  If you should develop severe or worsening symptoms return to the emergency department immediately

## 2023-09-23 NOTE — ED Triage Notes (Signed)
Pt tripped and fell landing on left knee and left shoulder.  Pt with possible dislocation to left shoulder.  Denies hitting his head and denies taking blood thinners. Abrasion noted to left knee. Limited rom to lt. Shoulder.

## 2023-10-10 DIAGNOSIS — Z6826 Body mass index (BMI) 26.0-26.9, adult: Secondary | ICD-10-CM | POA: Diagnosis not present

## 2023-10-10 DIAGNOSIS — R030 Elevated blood-pressure reading, without diagnosis of hypertension: Secondary | ICD-10-CM | POA: Diagnosis not present

## 2023-10-10 DIAGNOSIS — S43005A Unspecified dislocation of left shoulder joint, initial encounter: Secondary | ICD-10-CM | POA: Diagnosis not present

## 2023-10-17 DIAGNOSIS — R29898 Other symptoms and signs involving the musculoskeletal system: Secondary | ICD-10-CM | POA: Diagnosis not present

## 2023-10-17 DIAGNOSIS — M25612 Stiffness of left shoulder, not elsewhere classified: Secondary | ICD-10-CM | POA: Diagnosis not present

## 2023-10-17 DIAGNOSIS — R600 Localized edema: Secondary | ICD-10-CM | POA: Diagnosis not present

## 2023-10-17 DIAGNOSIS — M25512 Pain in left shoulder: Secondary | ICD-10-CM | POA: Diagnosis not present

## 2023-10-24 DIAGNOSIS — R600 Localized edema: Secondary | ICD-10-CM | POA: Diagnosis not present

## 2023-10-24 DIAGNOSIS — M25512 Pain in left shoulder: Secondary | ICD-10-CM | POA: Diagnosis not present

## 2023-10-24 DIAGNOSIS — R29898 Other symptoms and signs involving the musculoskeletal system: Secondary | ICD-10-CM | POA: Diagnosis not present

## 2023-10-24 DIAGNOSIS — M25612 Stiffness of left shoulder, not elsewhere classified: Secondary | ICD-10-CM | POA: Diagnosis not present

## 2023-10-26 DIAGNOSIS — M25512 Pain in left shoulder: Secondary | ICD-10-CM | POA: Diagnosis not present

## 2023-10-26 DIAGNOSIS — R29898 Other symptoms and signs involving the musculoskeletal system: Secondary | ICD-10-CM | POA: Diagnosis not present

## 2023-10-26 DIAGNOSIS — M25612 Stiffness of left shoulder, not elsewhere classified: Secondary | ICD-10-CM | POA: Diagnosis not present

## 2023-10-26 DIAGNOSIS — R600 Localized edema: Secondary | ICD-10-CM | POA: Diagnosis not present

## 2023-10-29 DIAGNOSIS — N1831 Chronic kidney disease, stage 3a: Secondary | ICD-10-CM | POA: Diagnosis not present

## 2023-10-29 DIAGNOSIS — E1169 Type 2 diabetes mellitus with other specified complication: Secondary | ICD-10-CM | POA: Diagnosis not present

## 2023-10-29 DIAGNOSIS — M19011 Primary osteoarthritis, right shoulder: Secondary | ICD-10-CM | POA: Diagnosis not present

## 2023-10-29 DIAGNOSIS — M7542 Impingement syndrome of left shoulder: Secondary | ICD-10-CM | POA: Diagnosis not present

## 2023-11-08 DIAGNOSIS — M7542 Impingement syndrome of left shoulder: Secondary | ICD-10-CM | POA: Diagnosis not present

## 2023-11-08 DIAGNOSIS — M7582 Other shoulder lesions, left shoulder: Secondary | ICD-10-CM | POA: Diagnosis not present

## 2023-11-08 DIAGNOSIS — S43432A Superior glenoid labrum lesion of left shoulder, initial encounter: Secondary | ICD-10-CM | POA: Diagnosis not present

## 2023-11-08 DIAGNOSIS — M129 Arthropathy, unspecified: Secondary | ICD-10-CM | POA: Diagnosis not present

## 2023-11-16 ENCOUNTER — Encounter (INDEPENDENT_AMBULATORY_CARE_PROVIDER_SITE_OTHER): Payer: Self-pay | Admitting: *Deleted

## 2023-12-05 ENCOUNTER — Other Ambulatory Visit (INDEPENDENT_AMBULATORY_CARE_PROVIDER_SITE_OTHER): Payer: Self-pay | Admitting: Gastroenterology

## 2023-12-20 DIAGNOSIS — N1831 Chronic kidney disease, stage 3a: Secondary | ICD-10-CM | POA: Diagnosis not present

## 2023-12-20 DIAGNOSIS — E782 Mixed hyperlipidemia: Secondary | ICD-10-CM | POA: Diagnosis not present

## 2023-12-20 DIAGNOSIS — E7849 Other hyperlipidemia: Secondary | ICD-10-CM | POA: Diagnosis not present

## 2023-12-20 DIAGNOSIS — I10 Essential (primary) hypertension: Secondary | ICD-10-CM | POA: Diagnosis not present

## 2023-12-20 DIAGNOSIS — R7301 Impaired fasting glucose: Secondary | ICD-10-CM | POA: Diagnosis not present

## 2023-12-26 DIAGNOSIS — I10 Essential (primary) hypertension: Secondary | ICD-10-CM | POA: Diagnosis not present

## 2023-12-26 DIAGNOSIS — M7542 Impingement syndrome of left shoulder: Secondary | ICD-10-CM | POA: Diagnosis not present

## 2023-12-26 DIAGNOSIS — Z6827 Body mass index (BMI) 27.0-27.9, adult: Secondary | ICD-10-CM | POA: Diagnosis not present

## 2023-12-26 DIAGNOSIS — N1831 Chronic kidney disease, stage 3a: Secondary | ICD-10-CM | POA: Diagnosis not present

## 2023-12-26 DIAGNOSIS — E782 Mixed hyperlipidemia: Secondary | ICD-10-CM | POA: Diagnosis not present

## 2024-03-24 DIAGNOSIS — E1169 Type 2 diabetes mellitus with other specified complication: Secondary | ICD-10-CM | POA: Diagnosis not present

## 2024-03-24 DIAGNOSIS — Z1329 Encounter for screening for other suspected endocrine disorder: Secondary | ICD-10-CM | POA: Diagnosis not present

## 2024-03-24 DIAGNOSIS — E871 Hypo-osmolality and hyponatremia: Secondary | ICD-10-CM | POA: Diagnosis not present

## 2024-03-24 DIAGNOSIS — J069 Acute upper respiratory infection, unspecified: Secondary | ICD-10-CM | POA: Diagnosis not present

## 2024-03-24 DIAGNOSIS — E7801 Familial hypercholesterolemia: Secondary | ICD-10-CM | POA: Diagnosis not present

## 2024-03-24 DIAGNOSIS — N1831 Chronic kidney disease, stage 3a: Secondary | ICD-10-CM | POA: Diagnosis not present

## 2024-03-24 DIAGNOSIS — Z6827 Body mass index (BMI) 27.0-27.9, adult: Secondary | ICD-10-CM | POA: Diagnosis not present

## 2024-03-31 DIAGNOSIS — E782 Mixed hyperlipidemia: Secondary | ICD-10-CM | POA: Diagnosis not present

## 2024-03-31 DIAGNOSIS — I10 Essential (primary) hypertension: Secondary | ICD-10-CM | POA: Diagnosis not present

## 2024-03-31 DIAGNOSIS — Z6827 Body mass index (BMI) 27.0-27.9, adult: Secondary | ICD-10-CM | POA: Diagnosis not present

## 2024-03-31 DIAGNOSIS — N1831 Chronic kidney disease, stage 3a: Secondary | ICD-10-CM | POA: Diagnosis not present

## 2024-03-31 DIAGNOSIS — E7800 Pure hypercholesterolemia, unspecified: Secondary | ICD-10-CM | POA: Diagnosis not present

## 2024-05-19 ENCOUNTER — Other Ambulatory Visit: Payer: Self-pay

## 2024-05-19 DIAGNOSIS — N401 Enlarged prostate with lower urinary tract symptoms: Secondary | ICD-10-CM

## 2024-05-19 NOTE — Telephone Encounter (Signed)
 Spoke with patient granddaughter and made her aware patient is scheduled for follow up appt.

## 2024-05-19 NOTE — Telephone Encounter (Signed)
-----   Message from Nurse [facility] sent at 05/19/2024 10:08 AM EDT ----- Please f/u

## 2024-05-21 MED ORDER — TAMSULOSIN HCL 0.4 MG PO CAPS: 0.4000 mg | ORAL_CAPSULE | Freq: Every day | ORAL | 11 refills | Status: DC

## 2024-05-23 NOTE — Telephone Encounter (Signed)
 Pt's granddaughter was made aware Rx sent to pharmacy on file. Voiced understanding.

## 2024-06-18 DIAGNOSIS — I10 Essential (primary) hypertension: Secondary | ICD-10-CM | POA: Diagnosis not present

## 2024-06-18 DIAGNOSIS — R7301 Impaired fasting glucose: Secondary | ICD-10-CM | POA: Diagnosis not present

## 2024-06-18 DIAGNOSIS — E1169 Type 2 diabetes mellitus with other specified complication: Secondary | ICD-10-CM | POA: Diagnosis not present

## 2024-06-18 DIAGNOSIS — Z130 Encounter for screening for diseases of the blood and blood-forming organs and certain disorders involving the immune mechanism: Secondary | ICD-10-CM | POA: Diagnosis not present

## 2024-06-18 DIAGNOSIS — E7849 Other hyperlipidemia: Secondary | ICD-10-CM | POA: Diagnosis not present

## 2024-06-18 DIAGNOSIS — N1831 Chronic kidney disease, stage 3a: Secondary | ICD-10-CM | POA: Diagnosis not present

## 2024-06-18 DIAGNOSIS — E871 Hypo-osmolality and hyponatremia: Secondary | ICD-10-CM | POA: Diagnosis not present

## 2024-06-25 DIAGNOSIS — Z1389 Encounter for screening for other disorder: Secondary | ICD-10-CM | POA: Diagnosis not present

## 2024-06-25 DIAGNOSIS — E7800 Pure hypercholesterolemia, unspecified: Secondary | ICD-10-CM | POA: Diagnosis not present

## 2024-06-25 DIAGNOSIS — Z0000 Encounter for general adult medical examination without abnormal findings: Secondary | ICD-10-CM | POA: Diagnosis not present

## 2024-06-25 DIAGNOSIS — I10 Essential (primary) hypertension: Secondary | ICD-10-CM | POA: Diagnosis not present

## 2024-06-25 DIAGNOSIS — Z0001 Encounter for general adult medical examination with abnormal findings: Secondary | ICD-10-CM | POA: Diagnosis not present

## 2024-06-25 DIAGNOSIS — Z23 Encounter for immunization: Secondary | ICD-10-CM | POA: Diagnosis not present

## 2024-06-25 DIAGNOSIS — E782 Mixed hyperlipidemia: Secondary | ICD-10-CM | POA: Diagnosis not present

## 2024-06-25 DIAGNOSIS — Z1331 Encounter for screening for depression: Secondary | ICD-10-CM | POA: Diagnosis not present

## 2024-06-25 DIAGNOSIS — N1831 Chronic kidney disease, stage 3a: Secondary | ICD-10-CM | POA: Diagnosis not present

## 2024-08-06 ENCOUNTER — Encounter (INDEPENDENT_AMBULATORY_CARE_PROVIDER_SITE_OTHER): Payer: Self-pay | Admitting: Gastroenterology

## 2024-08-20 ENCOUNTER — Ambulatory Visit: Payer: Medicare PPO | Admitting: Urology

## 2024-09-16 ENCOUNTER — Ambulatory Visit: Admitting: Urology

## 2024-09-28 NOTE — Progress Notes (Unsigned)
 "   History of Present Illness: 83 YO male is here for follow up of BPH w/ sx's.  He does have a history of urinary retention following an inguinal hernia repair in October 2024.  He had short-term catheter placement.  He is on tamsulosin  on a daily basis.  At his last visit in October 2024 his residual urine volume was 0.  Last PSA, checked just over 2 years ago was 3.5.  He stopped taking tamsulosin  a month or 2 ago when his prescription ran out.  He does not think that he has been voiding any worse since it was stopped.  Current IPSS 34, he actually can tolerate his symptoms pretty well.  Past Medical History:  Diagnosis Date   Diabetes mellitus without complication (HCC)    Elevated cholesterol    Hypertension     Past Surgical History:  Procedure Laterality Date   BACK SURGERY     ESOPHAGOGASTRODUODENOSCOPY (EGD) WITH PROPOFOL  N/A 02/02/2023   Procedure: ESOPHAGOGASTRODUODENOSCOPY (EGD) WITH PROPOFOL ;  Surgeon: Eartha Angelia Sieving, MD;  Location: [facility];  Service: Gastroenterology;  Laterality: N/A;  1:30pm;ASA 3   TUMOR REMOVAL     XI ROBOTIC ASSISTED INGUINAL HERNIA REPAIR WITH MESH Right 07/09/2023   Procedure: XI ROBOTIC ASSISTED INGUINAL HERNIA REPAIR WITH MESH;  Surgeon: Mavis Anes, MD;  Location: [facility];  Service: General;  Laterality: Right;    Home Medications:  Allergies as of 09/30/2024       Reactions   Penicillins Itching   Did it involve swelling of the face/tongue/throat, SOB, or low BP? No Did it involve sudden or severe rash/hives, skin peeling, or any reaction on the inside of your mouth or nose? Yes Did you need to seek medical attention at a hospital or doctor's office? No When did it last happen?   Over 10 years ago If all above answers are NO, may proceed with cephalosporin use.        Medication List        Accurate as of September 28, 2024  7:26 PM. If you have any questions, ask your nurse or doctor.          amLODipine  5 MG tablet Commonly known as: NORVASC Take 5 mg by mouth every evening.   atorvastatin  20 MG tablet Commonly known as: LIPITOR Take 20 mg by mouth in the morning.   metFORMIN 500 MG tablet Commonly known as: GLUCOPHAGE Take 500 mg by mouth 2 (two) times daily.   metoprolol  tartrate 50 MG tablet Commonly known as: LOPRESSOR  Take 50 mg by mouth 2 (two) times daily.   naproxen  500 MG tablet Commonly known as: Naprosyn  Take 1 tablet (500 mg total) by mouth 2 (two) times daily with a meal.   omeprazole  40 MG capsule Commonly known as: PRILOSEC TAKE 1 CAPSULE TWICE DAILY   tamsulosin  0.4 MG Caps capsule Commonly known as: FLOMAX  Take 1 capsule (0.4 mg total) by mouth daily.   traMADol  50 MG tablet Commonly known as: Ultram  Take 1 tablet (50 mg total) by mouth every 6 (six) hours as needed.        Allergies: Allergies[1]  Family History  Problem Relation Age of Onset   Diabetes Mother    Aneurysm Mother     Social History:  reports that he has never smoked. He has never used smokeless tobacco. He reports that he does not drink alcohol  and does not use drugs.  ROS: A complete review of systems was performed.  All systems are negative except for pertinent findings as noted.  Physical Exam:  Vital signs in last 24 hours: There were no vitals taken for this visit. Constitutional:  Alert and oriented, No acute distress Cardiovascular: Regular rate  Respiratory: Normal respiratory effort Neurologic: Grossly intact, no focal deficits Psychiatric: Normal mood and affect  I have reviewed prior pt notes  I have reviewed urinalysis results--clear  I have independently reviewed prior imaging--CT scan from 2020 reviewed.  Prostate volume estimated at 100 mL.  I have reviewed prior PSA results--3.5 in August 2023  I have reviewed prior urine culture--Citrobacter grew out at the time of his urinary retention   Impression/Assessment:  BPH with symptoms.  Estimated  prostate volume 100 mL.  When last checked, he was emptying out well.  Not voiding any worse off of tamsulosin   Plan:  I gave him the choice of getting back on tamsulosin , trying something different (finasteride) or having him stay off of medication altogether.  He like to try the finasteride.  I sent a longstanding prescription in for that.  As he is overall doing well, he will get this refilled by Dr. Lari if he wants, and return as needed.  He would like to do that.     [1]  Allergies Allergen Reactions   Penicillins Itching    Did it involve swelling of the face/tongue/throat, SOB, or low BP? No Did it involve sudden or severe rash/hives, skin peeling, or any reaction on the inside of your mouth or nose? Yes Did you need to seek medical attention at a hospital or doctor's office? No When did it last happen?   Over 10 years ago If all above answers are NO, may proceed with cephalosporin use.   "

## 2024-09-30 ENCOUNTER — Ambulatory Visit: Admitting: Urology

## 2024-09-30 VITALS — BP 134/69 | HR 60

## 2024-09-30 DIAGNOSIS — N401 Enlarged prostate with lower urinary tract symptoms: Secondary | ICD-10-CM

## 2024-09-30 DIAGNOSIS — Z87448 Personal history of other diseases of urinary system: Secondary | ICD-10-CM | POA: Diagnosis not present

## 2024-09-30 DIAGNOSIS — R300 Dysuria: Secondary | ICD-10-CM

## 2024-09-30 DIAGNOSIS — R351 Nocturia: Secondary | ICD-10-CM

## 2024-09-30 LAB — MICROSCOPIC EXAMINATION
Bacteria, UA: NONE SEEN
WBC, UA: NONE SEEN /HPF (ref 0–5)

## 2024-09-30 LAB — URINALYSIS, ROUTINE W REFLEX MICROSCOPIC
Bilirubin, UA: NEGATIVE
Glucose, UA: NEGATIVE
Ketones, UA: NEGATIVE
Leukocytes,UA: NEGATIVE
Nitrite, UA: NEGATIVE
Protein,UA: NEGATIVE
Specific Gravity, UA: 1.02 (ref 1.005–1.030)
Urobilinogen, Ur: 0.2 mg/dL (ref 0.2–1.0)
pH, UA: 6 (ref 5.0–7.5)

## 2024-09-30 MED ORDER — FINASTERIDE 5 MG PO TABS: 5.0000 mg | ORAL_TABLET | Freq: Every day | ORAL | 11 refills | Status: AC

## 2024-10-07 ENCOUNTER — Encounter (INDEPENDENT_AMBULATORY_CARE_PROVIDER_SITE_OTHER): Payer: Self-pay | Admitting: *Deleted

## 2024-11-05 ENCOUNTER — Telehealth (INDEPENDENT_AMBULATORY_CARE_PROVIDER_SITE_OTHER): Payer: Self-pay

## 2024-11-05 NOTE — Telephone Encounter (Signed)
 Who is your primary care physician: Elspeth Messier, MD  Reasons for the colonoscopy: screening, history of colon polyps  Have you had a colonoscopy before?  Yes, 2 years ago  Do you have family history of colon cancer? no  Previous colonoscopy with polyps removed? Yes, 2 years ago  Do you have a history colorectal cancer?   no  Are you diabetic? If yes, Type 1 or Type 2?    no  Do you have a prosthetic or mechanical heart valve? no  Do you have a pacemaker/defibrillator?   no  Have you had endocarditis/atrial fibrillation? no  Have you had joint replacement within the last 12 months?  no  Do you tend to be constipated or have to use laxatives? no  Do you have any history of drugs or alcohol ?  no  Do you use supplemental oxygen ?  no  Have you had a stroke or heart attack within the last 6 months? no  Do you take weight loss medication?  No  Do you take any blood-thinning medications such as: (aspirin, warfarin, Plavix, Aggrenox)  no  If yes we need the name, milligram, dosage and who is prescribing doctor   Current Outpatient Medications  Medication Sig Dispense Refill   amLODipine (NORVASC) 5 MG tablet Take 5 mg by mouth every evening.     atorvastatin  (LIPITOR) 20 MG tablet Take 20 mg by mouth in the morning.     finasteride  (PROSCAR ) 5 MG tablet Take 1 tablet (5 mg total) by mouth daily. 30 tablet 11   metFORMIN (GLUCOPHAGE) 500 MG tablet Take 500 mg by mouth 2 (two) times daily.     metoprolol  (LOPRESSOR ) 50 MG tablet Take 50 mg by mouth 2 (two) times daily.     omeprazole  (PRILOSEC) 40 MG capsule TAKE 1 CAPSULE TWICE DAILY 180 capsule 1   No current facility-administered medications for this visit.    Allergies[1]  Pharmacy: [facility]  Primary Insurance Name: [employer]     [1]  Allergies Allergen Reactions   Penicillins Itching    Did it involve swelling of the face/tongue/throat, SOB, or low BP? No Did it involve sudden or severe rash/hives, skin peeling,  or any reaction on the inside of your mouth or nose? Yes Did you need to seek medical attention at a hospital or doctor's office? No When did it last happen?   Over 10 years ago If all above answers are NO, may proceed with cephalosporin use.

## 2024-11-05 NOTE — Telephone Encounter (Signed)
Ok to schedule.  Room :Any   Thanks,  Vista Lawman, MD Gastroenterology and Hepatology [facility]

## 2024-11-06 ENCOUNTER — Encounter (INDEPENDENT_AMBULATORY_CARE_PROVIDER_SITE_OTHER): Payer: Self-pay | Admitting: *Deleted

## 2024-11-06 MED ORDER — PEG 3350-KCL-NA BICARB-NACL 420 G PO SOLR: 4000.0000 mL | Freq: Once | ORAL | 0 refills | Status: AC

## 2024-11-06 NOTE — Telephone Encounter (Signed)
 PA on [employer] for TCS: Prior authorization is not required for this code.

## 2024-11-06 NOTE — Telephone Encounter (Signed)
 Spoke with granddaughter Harlene (ON DPR), scheduled TCS for 11/26/2024 at 8:30am. Rx sent to pharmacy. Instructions mailed.

## 2024-11-06 NOTE — Addendum Note (Signed)
 Addended by: DALLIE LIONEL RAMAN on: 11/06/2024 11:34 AM   Modules accepted: Orders

## 2024-11-06 NOTE — Telephone Encounter (Signed)
 Referral completed, TCS apt letter sent to PCP

## 2024-11-26 ENCOUNTER — Ambulatory Visit (HOSPITAL_COMMUNITY): Admit: 2024-11-26 | Admitting: Gastroenterology

## 2024-11-26 ENCOUNTER — Encounter (HOSPITAL_COMMUNITY): Payer: Self-pay
